# Patient Record
Sex: Female | Born: 1945 | Race: Black or African American | Hispanic: No | Marital: Married | Smoking: Never smoker
Health system: Southern US, Community
[De-identification: ages and names within clinical notes are randomized; demographics above are authoritative.]

## PROBLEM LIST (undated history)

## (undated) DIAGNOSIS — M1990 Unspecified osteoarthritis, unspecified site: Secondary | ICD-10-CM

## (undated) DIAGNOSIS — IMO0001 Reserved for inherently not codable concepts without codable children: Secondary | ICD-10-CM

## (undated) DIAGNOSIS — I10 Essential (primary) hypertension: Secondary | ICD-10-CM

## (undated) DIAGNOSIS — Z5189 Encounter for other specified aftercare: Secondary | ICD-10-CM

## (undated) DIAGNOSIS — K219 Gastro-esophageal reflux disease without esophagitis: Secondary | ICD-10-CM

## (undated) DIAGNOSIS — G709 Myoneural disorder, unspecified: Secondary | ICD-10-CM

## (undated) DIAGNOSIS — J069 Acute upper respiratory infection, unspecified: Secondary | ICD-10-CM

## (undated) DIAGNOSIS — C801 Malignant (primary) neoplasm, unspecified: Secondary | ICD-10-CM

## (undated) DIAGNOSIS — N189 Chronic kidney disease, unspecified: Secondary | ICD-10-CM

## (undated) DIAGNOSIS — G4730 Sleep apnea, unspecified: Secondary | ICD-10-CM

## (undated) HISTORY — DX: Gastro-esophageal reflux disease without esophagitis: K21.9

## (undated) HISTORY — DX: Chronic kidney disease, unspecified: N18.9

## (undated) HISTORY — DX: Malignant (primary) neoplasm, unspecified: C80.1

## (undated) HISTORY — DX: Reserved for inherently not codable concepts without codable children: IMO0001

## (undated) HISTORY — PX: UPPER GASTROINTESTINAL ENDOSCOPY: SHX188

## (undated) HISTORY — PX: INSERTION OF DIALYSIS CATHETER: SHX1324

## (undated) HISTORY — PX: VAGINAL HYSTERECTOMY: SUR661

## (undated) HISTORY — PX: CATARACT EXTRACTION W/ INTRAOCULAR LENS  IMPLANT, BILATERAL: SHX1307

## (undated) HISTORY — DX: Myoneural disorder, unspecified: G70.9

## (undated) HISTORY — PX: BREAST SURGERY: SHX581

## (undated) HISTORY — PX: DILATION AND CURETTAGE OF UTERUS: SHX78

## (undated) HISTORY — PX: BACK SURGERY: SHX140

## (undated) HISTORY — PX: COLONOSCOPY: SHX174

## (undated) HISTORY — DX: Encounter for other specified aftercare: Z51.89

## (undated) HISTORY — DX: Essential (primary) hypertension: I10

## (undated) HISTORY — PX: EYE SURGERY: SHX253

---

## 1999-12-17 ENCOUNTER — Emergency Department (HOSPITAL_COMMUNITY): Admission: EM | Admit: 1999-12-17 | Discharge: 1999-12-17 | Payer: Self-pay | Admitting: Emergency Medicine

## 1999-12-17 ENCOUNTER — Encounter: Payer: Self-pay | Admitting: Emergency Medicine

## 1999-12-21 ENCOUNTER — Inpatient Hospital Stay (HOSPITAL_COMMUNITY): Admission: AD | Admit: 1999-12-21 | Discharge: 1999-12-24 | Payer: Self-pay | Admitting: Neurology

## 1999-12-22 ENCOUNTER — Encounter: Payer: Self-pay | Admitting: Neurology

## 2000-01-20 ENCOUNTER — Ambulatory Visit (HOSPITAL_COMMUNITY): Admission: RE | Admit: 2000-01-20 | Discharge: 2000-01-20 | Payer: Self-pay | Admitting: Neurology

## 2000-01-23 ENCOUNTER — Emergency Department (HOSPITAL_COMMUNITY): Admission: EM | Admit: 2000-01-23 | Discharge: 2000-01-23 | Payer: Self-pay | Admitting: Emergency Medicine

## 2001-02-07 ENCOUNTER — Inpatient Hospital Stay (HOSPITAL_COMMUNITY): Admission: EM | Admit: 2001-02-07 | Discharge: 2001-02-09 | Payer: Self-pay | Admitting: Emergency Medicine

## 2001-02-07 ENCOUNTER — Encounter: Payer: Self-pay | Admitting: Internal Medicine

## 2001-02-07 ENCOUNTER — Encounter (INDEPENDENT_AMBULATORY_CARE_PROVIDER_SITE_OTHER): Payer: Self-pay | Admitting: *Deleted

## 2001-02-07 ENCOUNTER — Encounter: Payer: Self-pay | Admitting: Emergency Medicine

## 2001-02-08 ENCOUNTER — Encounter: Payer: Self-pay | Admitting: Gastroenterology

## 2001-02-09 ENCOUNTER — Encounter: Payer: Self-pay | Admitting: Gastroenterology

## 2001-03-22 ENCOUNTER — Encounter: Payer: Self-pay | Admitting: Gastroenterology

## 2001-03-30 ENCOUNTER — Other Ambulatory Visit: Admission: RE | Admit: 2001-03-30 | Discharge: 2001-03-30 | Payer: Self-pay | Admitting: Obstetrics & Gynecology

## 2003-06-27 ENCOUNTER — Inpatient Hospital Stay (HOSPITAL_COMMUNITY): Admission: AD | Admit: 2003-06-27 | Discharge: 2003-07-03 | Payer: Self-pay | Admitting: Internal Medicine

## 2003-08-01 ENCOUNTER — Ambulatory Visit (HOSPITAL_COMMUNITY): Admission: RE | Admit: 2003-08-01 | Discharge: 2003-08-01 | Payer: Self-pay | Admitting: Vascular Surgery

## 2003-09-26 ENCOUNTER — Ambulatory Visit (HOSPITAL_COMMUNITY): Admission: RE | Admit: 2003-09-26 | Discharge: 2003-09-26 | Payer: Self-pay | Admitting: Nephrology

## 2003-09-27 ENCOUNTER — Ambulatory Visit (HOSPITAL_COMMUNITY): Admission: AD | Admit: 2003-09-27 | Discharge: 2003-09-27 | Payer: Self-pay | Admitting: Vascular Surgery

## 2003-10-07 ENCOUNTER — Encounter: Admission: RE | Admit: 2003-10-07 | Discharge: 2003-10-07 | Payer: Self-pay | Admitting: Nephrology

## 2003-10-15 ENCOUNTER — Other Ambulatory Visit: Admission: RE | Admit: 2003-10-15 | Discharge: 2003-10-15 | Payer: Self-pay | Admitting: Obstetrics and Gynecology

## 2003-10-31 ENCOUNTER — Encounter: Admission: RE | Admit: 2003-10-31 | Discharge: 2003-10-31 | Payer: Self-pay | Admitting: Internal Medicine

## 2003-11-04 ENCOUNTER — Ambulatory Visit (HOSPITAL_COMMUNITY): Admission: RE | Admit: 2003-11-04 | Discharge: 2003-11-04 | Payer: Self-pay | Admitting: Obstetrics and Gynecology

## 2003-11-04 ENCOUNTER — Encounter (INDEPENDENT_AMBULATORY_CARE_PROVIDER_SITE_OTHER): Payer: Self-pay | Admitting: Specialist

## 2004-01-29 ENCOUNTER — Ambulatory Visit (HOSPITAL_COMMUNITY): Admission: RE | Admit: 2004-01-29 | Discharge: 2004-01-29 | Payer: Self-pay | Admitting: Nephrology

## 2004-04-29 ENCOUNTER — Ambulatory Visit (HOSPITAL_COMMUNITY): Admission: RE | Admit: 2004-04-29 | Discharge: 2004-04-29 | Payer: Self-pay | Admitting: Nephrology

## 2004-05-12 ENCOUNTER — Ambulatory Visit (HOSPITAL_COMMUNITY): Admission: RE | Admit: 2004-05-12 | Discharge: 2004-05-12 | Payer: Self-pay | Admitting: Vascular Surgery

## 2004-09-14 ENCOUNTER — Ambulatory Visit (HOSPITAL_COMMUNITY): Admission: RE | Admit: 2004-09-14 | Discharge: 2004-09-14 | Payer: Self-pay | Admitting: Vascular Surgery

## 2004-10-26 ENCOUNTER — Ambulatory Visit (HOSPITAL_COMMUNITY): Admission: RE | Admit: 2004-10-26 | Discharge: 2004-10-26 | Payer: Self-pay | Admitting: Nephrology

## 2005-08-22 DIAGNOSIS — N189 Chronic kidney disease, unspecified: Secondary | ICD-10-CM

## 2005-08-22 HISTORY — DX: Chronic kidney disease, unspecified: N18.9

## 2005-10-20 HISTORY — PX: TUMOR REMOVAL: SHX12

## 2005-10-21 HISTORY — PX: KIDNEY TRANSPLANT: SHX239

## 2005-10-28 ENCOUNTER — Emergency Department (HOSPITAL_COMMUNITY): Admission: EM | Admit: 2005-10-28 | Discharge: 2005-10-28 | Payer: Self-pay | Admitting: Emergency Medicine

## 2005-11-01 ENCOUNTER — Emergency Department (HOSPITAL_COMMUNITY): Admission: EM | Admit: 2005-11-01 | Discharge: 2005-11-01 | Payer: Self-pay | Admitting: Emergency Medicine

## 2005-12-26 ENCOUNTER — Emergency Department (HOSPITAL_COMMUNITY): Admission: EM | Admit: 2005-12-26 | Discharge: 2005-12-27 | Payer: Self-pay | Admitting: Emergency Medicine

## 2007-01-02 ENCOUNTER — Ambulatory Visit: Payer: Self-pay | Admitting: Gastroenterology

## 2007-01-02 ENCOUNTER — Encounter: Admission: RE | Admit: 2007-01-02 | Discharge: 2007-01-02 | Payer: Self-pay | Admitting: Nephrology

## 2007-01-05 ENCOUNTER — Encounter: Admission: RE | Admit: 2007-01-05 | Discharge: 2007-01-05 | Payer: Self-pay | Admitting: Nephrology

## 2007-01-30 ENCOUNTER — Encounter: Payer: Self-pay | Admitting: Gastroenterology

## 2007-01-30 ENCOUNTER — Ambulatory Visit: Payer: Self-pay | Admitting: Gastroenterology

## 2007-02-08 ENCOUNTER — Encounter: Admission: RE | Admit: 2007-02-08 | Discharge: 2007-02-08 | Payer: Self-pay | Admitting: Nephrology

## 2007-07-10 ENCOUNTER — Encounter: Admission: RE | Admit: 2007-07-10 | Discharge: 2007-07-10 | Payer: Self-pay | Admitting: General Surgery

## 2008-08-22 DIAGNOSIS — C801 Malignant (primary) neoplasm, unspecified: Secondary | ICD-10-CM

## 2008-08-22 HISTORY — DX: Malignant (primary) neoplasm, unspecified: C80.1

## 2009-01-13 ENCOUNTER — Encounter: Admission: RE | Admit: 2009-01-13 | Discharge: 2009-01-13 | Payer: Self-pay | Admitting: Nephrology

## 2009-01-15 ENCOUNTER — Encounter (INDEPENDENT_AMBULATORY_CARE_PROVIDER_SITE_OTHER): Payer: Self-pay | Admitting: Radiology

## 2009-01-15 ENCOUNTER — Encounter: Admission: RE | Admit: 2009-01-15 | Discharge: 2009-01-15 | Payer: Self-pay | Admitting: Nephrology

## 2009-02-04 ENCOUNTER — Emergency Department (HOSPITAL_COMMUNITY): Admission: EM | Admit: 2009-02-04 | Discharge: 2009-02-04 | Payer: Self-pay | Admitting: Emergency Medicine

## 2009-02-20 ENCOUNTER — Ambulatory Visit: Payer: Self-pay | Admitting: Genetic Counselor

## 2009-02-20 ENCOUNTER — Ambulatory Visit: Payer: Self-pay | Admitting: Oncology

## 2009-02-25 ENCOUNTER — Ambulatory Visit: Admission: RE | Admit: 2009-02-25 | Discharge: 2009-05-26 | Payer: Self-pay | Admitting: Radiation Oncology

## 2009-03-23 ENCOUNTER — Ambulatory Visit: Payer: Self-pay | Admitting: Oncology

## 2009-03-26 ENCOUNTER — Encounter: Admission: RE | Admit: 2009-03-26 | Discharge: 2009-03-26 | Payer: Self-pay | Admitting: General Surgery

## 2009-03-30 ENCOUNTER — Ambulatory Visit (HOSPITAL_BASED_OUTPATIENT_CLINIC_OR_DEPARTMENT_OTHER): Admission: RE | Admit: 2009-03-30 | Discharge: 2009-03-30 | Payer: Self-pay | Admitting: General Surgery

## 2009-03-30 ENCOUNTER — Encounter (INDEPENDENT_AMBULATORY_CARE_PROVIDER_SITE_OTHER): Payer: Self-pay | Admitting: General Surgery

## 2009-04-13 ENCOUNTER — Encounter (INDEPENDENT_AMBULATORY_CARE_PROVIDER_SITE_OTHER): Payer: Self-pay | Admitting: General Surgery

## 2009-04-13 ENCOUNTER — Ambulatory Visit (HOSPITAL_COMMUNITY): Admission: RE | Admit: 2009-04-13 | Discharge: 2009-04-13 | Payer: Self-pay | Admitting: General Surgery

## 2009-04-30 ENCOUNTER — Ambulatory Visit: Payer: Self-pay | Admitting: Oncology

## 2009-05-18 ENCOUNTER — Encounter: Payer: Self-pay | Admitting: Gastroenterology

## 2009-05-27 ENCOUNTER — Ambulatory Visit: Admission: RE | Admit: 2009-05-27 | Discharge: 2009-08-21 | Payer: Self-pay | Admitting: Radiation Oncology

## 2009-06-29 ENCOUNTER — Ambulatory Visit: Payer: Self-pay | Admitting: Oncology

## 2009-06-30 ENCOUNTER — Ambulatory Visit (HOSPITAL_COMMUNITY): Admission: RE | Admit: 2009-06-30 | Discharge: 2009-06-30 | Payer: Self-pay | Admitting: Anesthesiology

## 2009-07-01 LAB — CBC WITH DIFFERENTIAL/PLATELET
BASO%: 0.1 % (ref 0.0–2.0)
Basophils Absolute: 0 10*3/uL (ref 0.0–0.1)
EOS%: 7.3 % — ABNORMAL HIGH (ref 0.0–7.0)
HGB: 13.4 g/dL (ref 11.6–15.9)
MCH: 24.5 pg — ABNORMAL LOW (ref 25.1–34.0)
MCHC: 32.2 g/dL (ref 31.5–36.0)
MCV: 76.1 fL — ABNORMAL LOW (ref 79.5–101.0)
MONO%: 14.9 % — ABNORMAL HIGH (ref 0.0–14.0)
RDW: 16.1 % — ABNORMAL HIGH (ref 11.2–14.5)

## 2009-07-01 LAB — COMPREHENSIVE METABOLIC PANEL
ALT: 73 U/L — ABNORMAL HIGH (ref 0–35)
BUN: 13 mg/dL (ref 6–23)
CO2: 25 mEq/L (ref 19–32)
Calcium: 10 mg/dL (ref 8.4–10.5)
Chloride: 97 mEq/L (ref 96–112)
Creatinine, Ser: 0.8 mg/dL (ref 0.40–1.20)
Glucose, Bld: 291 mg/dL — ABNORMAL HIGH (ref 70–99)

## 2009-07-22 ENCOUNTER — Ambulatory Visit (HOSPITAL_COMMUNITY): Admission: RE | Admit: 2009-07-22 | Discharge: 2009-07-22 | Payer: Self-pay | Admitting: Anesthesiology

## 2009-07-23 ENCOUNTER — Emergency Department (HOSPITAL_COMMUNITY): Admission: EM | Admit: 2009-07-23 | Discharge: 2009-07-23 | Payer: Self-pay | Admitting: Emergency Medicine

## 2009-10-20 ENCOUNTER — Ambulatory Visit: Payer: Self-pay | Admitting: Oncology

## 2009-11-18 LAB — CBC WITH DIFFERENTIAL/PLATELET
Basophils Absolute: 0 10*3/uL (ref 0.0–0.1)
EOS%: 1.4 % (ref 0.0–7.0)
Eosinophils Absolute: 0.1 10*3/uL (ref 0.0–0.5)
HCT: 39.7 % (ref 34.8–46.6)
HGB: 13 g/dL (ref 11.6–15.9)
MCH: 23.9 pg — ABNORMAL LOW (ref 25.1–34.0)
MCV: 73.1 fL — ABNORMAL LOW (ref 79.5–101.0)
NEUT%: 58.3 % (ref 38.4–76.8)
lymph#: 1.7 10*3/uL (ref 0.9–3.3)

## 2009-11-18 LAB — COMPREHENSIVE METABOLIC PANEL
AST: 14 U/L (ref 0–37)
BUN: 12 mg/dL (ref 6–23)
Calcium: 9.8 mg/dL (ref 8.4–10.5)
Chloride: 100 mEq/L (ref 96–112)
Creatinine, Ser: 0.83 mg/dL (ref 0.40–1.20)
Glucose, Bld: 233 mg/dL — ABNORMAL HIGH (ref 70–99)

## 2010-01-26 ENCOUNTER — Encounter: Admission: RE | Admit: 2010-01-26 | Discharge: 2010-01-26 | Payer: Self-pay | Admitting: Oncology

## 2010-02-08 ENCOUNTER — Telehealth (INDEPENDENT_AMBULATORY_CARE_PROVIDER_SITE_OTHER): Payer: Self-pay | Admitting: *Deleted

## 2010-04-12 ENCOUNTER — Ambulatory Visit: Payer: Self-pay | Admitting: Oncology

## 2010-08-27 IMAGING — CT CT PELVIS W/O CM
1 series · 16 of 32 positions shown, 20 images · non-contrast
Comparison: None available

CLINICAL DATA: Left hip pain.  History breast carcinoma.

CT PELVIS WITHOUT CONTRAST
TECHNIQUE: Multidetector CT imaging of the pelvis was performed
following the standard protocol without intravenous contrast.

[Series 5: pelvis_hip 3.0 b40f st 3d · axial · 0.90mm/px · z∈[-562,-310]mm · 16 of 94 slices shown, 20 images]
[im 7/94  soft-tissue]
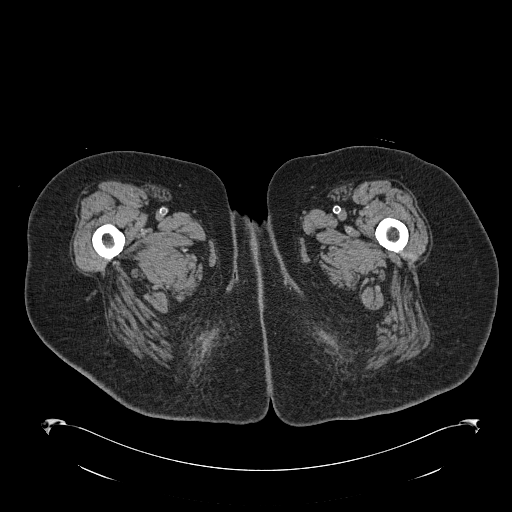
[im 7/94  bone]
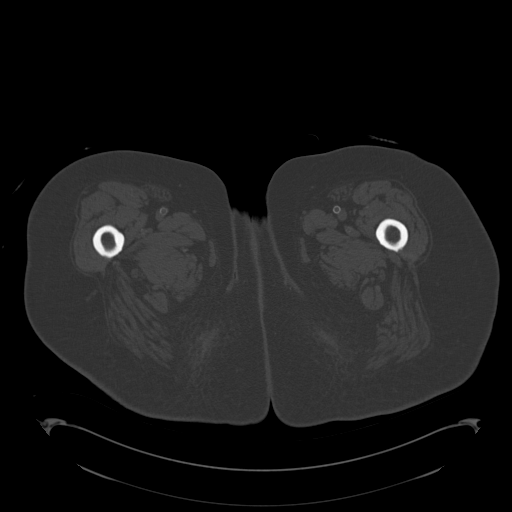
[im 13/94  soft-tissue]
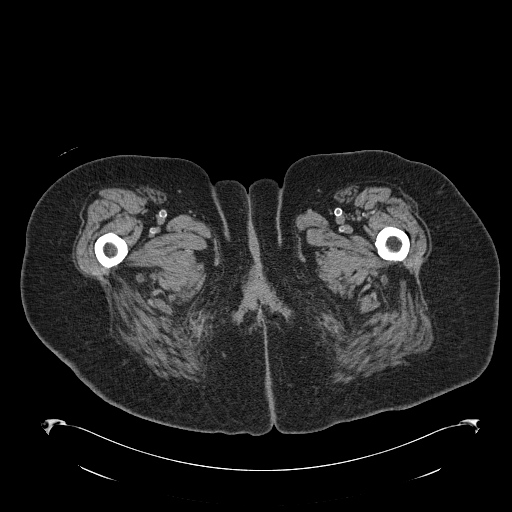
[im 19/94  soft-tissue]
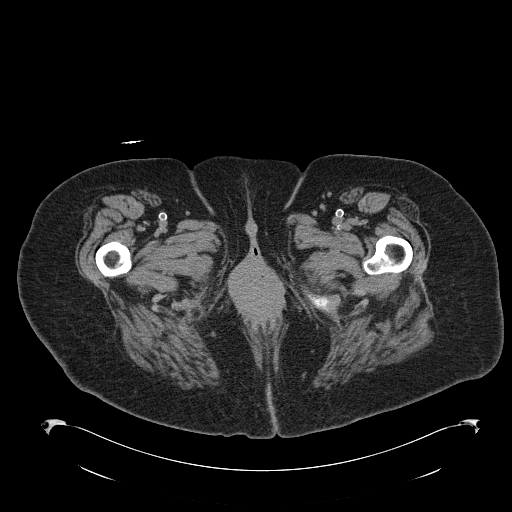
[im 25/94  soft-tissue]
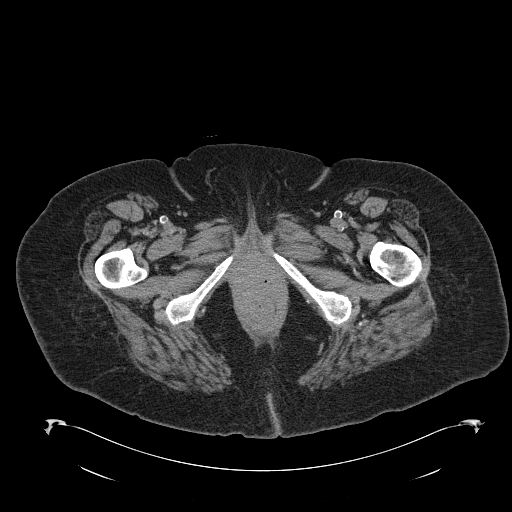
[im 31/94  soft-tissue]
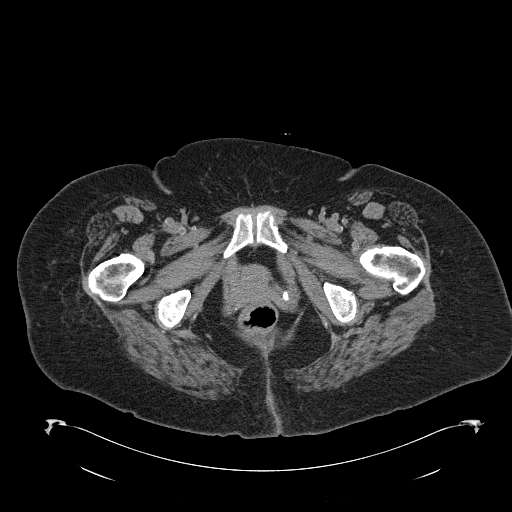
[im 37/94  soft-tissue]
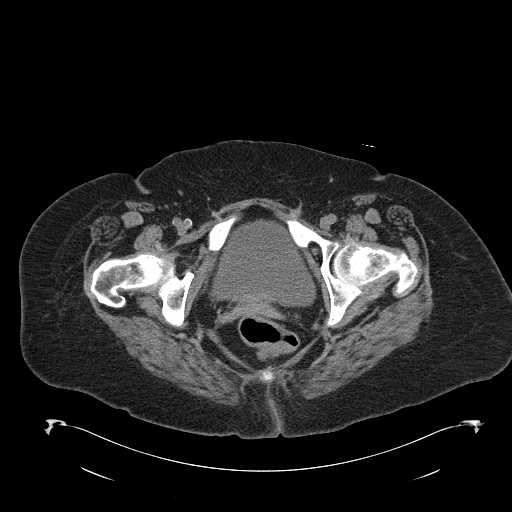
[im 43/94  soft-tissue]
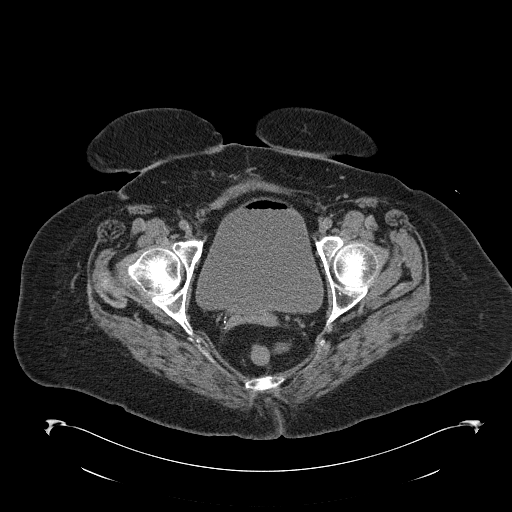
[im 52/94  soft-tissue]
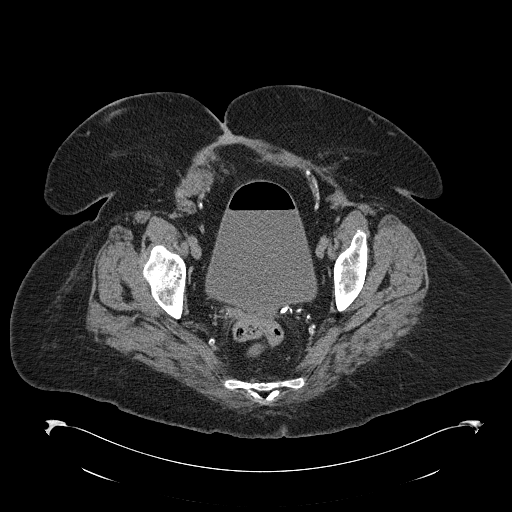
[im 58/94  soft-tissue]
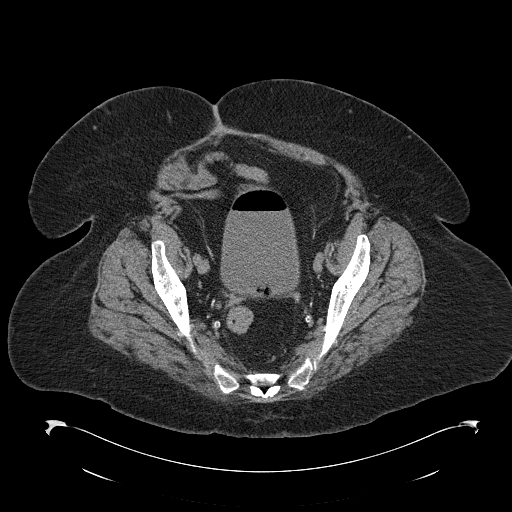
[im 58/94  bone]
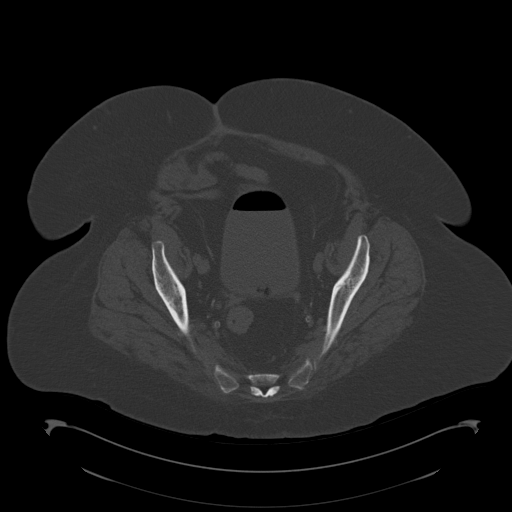
[im 64/94  soft-tissue]
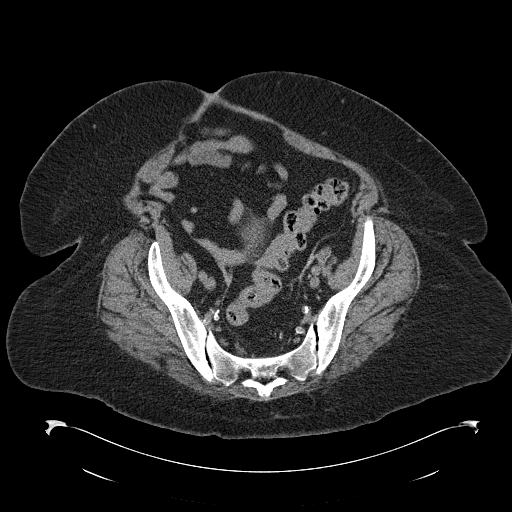
[im 70/94  soft-tissue]
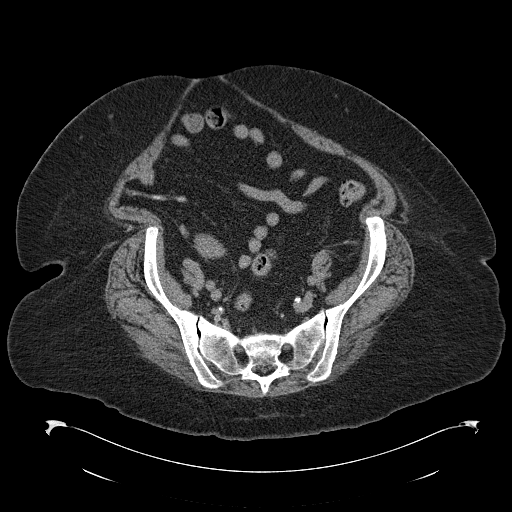
[im 76/94  soft-tissue]
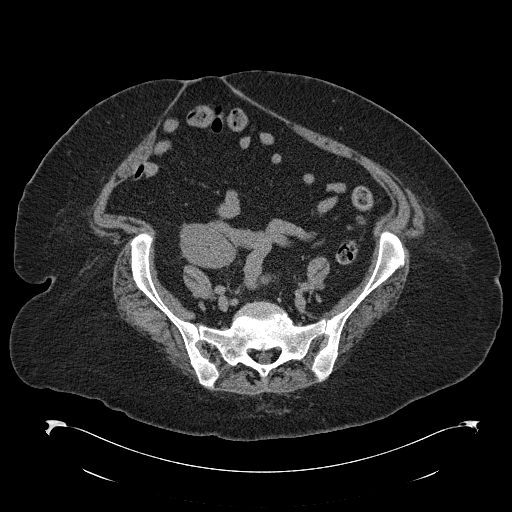
[im 82/94  soft-tissue]
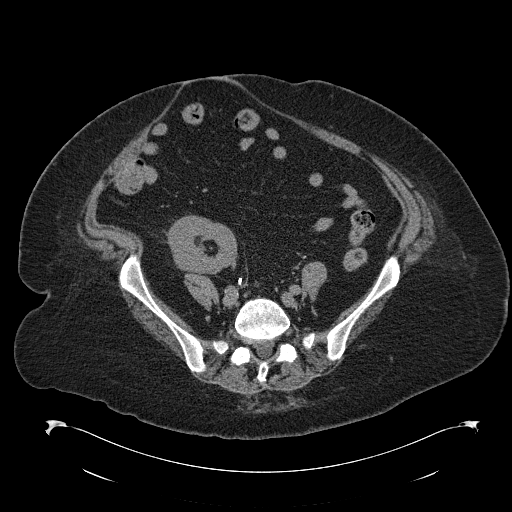
[im 82/94  lung]
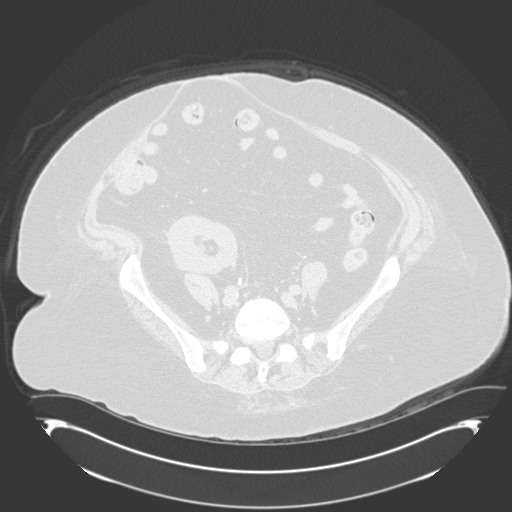
[im 85/94  lung]
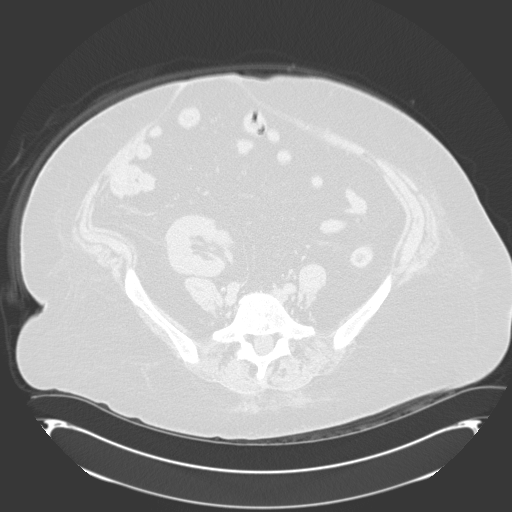
[im 88/94  soft-tissue]
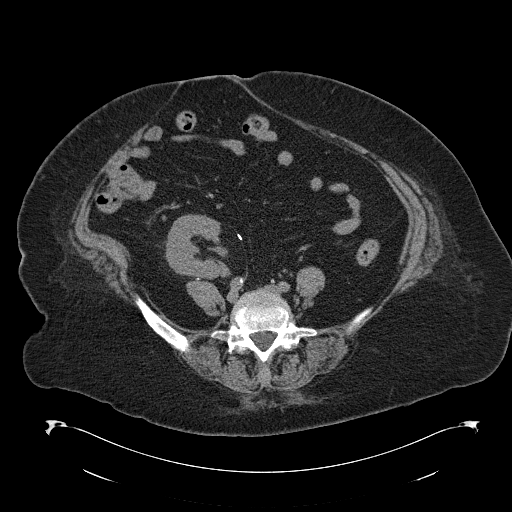
[im 88/94  lung]
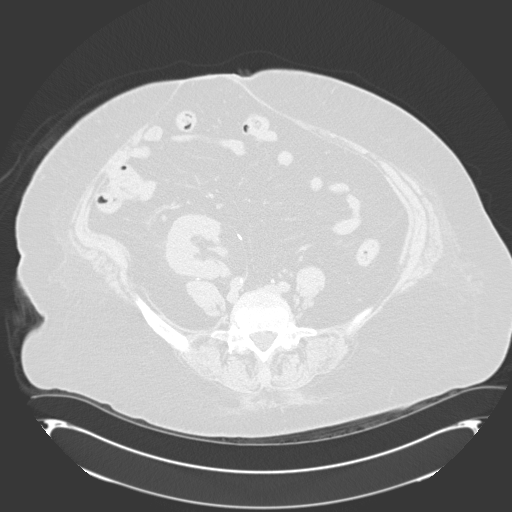
[im 91/94  lung]
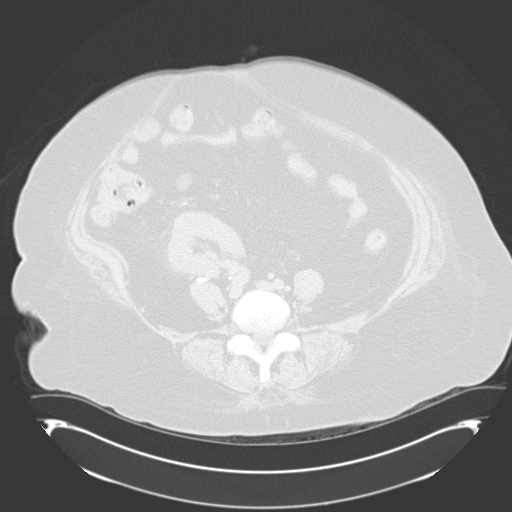

[16 of 32 positions shown; findings below may reference images not displayed]

FINDINGS: Negative for fracture or dislocation.  There is vacuum
phenomenon noted in both sacroiliac joints.  No lytic or sclerotic
lesion.  Sacrum intact.

There is gas in the distended urinary bladder.  No free fluid.
Normal appendix is noted.  Visualized portions of small bowel and
colon are decompressed.  Bilateral pelvic phleboliths.  Previous
hysterectomy.  There is a right pelvic kidney without
hydronephrosis.
IMPRESSION: 1.  Negative for fracture, dislocation, lytic or sclerotic bone
lesion.
2.  Distended urinary bladder containing gas.  Correlate with any
recent instrumentation to exclude fistula.

## 2010-09-13 ENCOUNTER — Encounter: Payer: Self-pay | Admitting: General Surgery

## 2010-09-23 NOTE — Progress Notes (Signed)
  Phone Note Other Incoming   Request: Send information Summary of Call: Request for records received from [facility] [place], [place].L.P Request forwarded to [facility].

## 2010-11-27 LAB — COMPREHENSIVE METABOLIC PANEL
ALT: 69 U/L — ABNORMAL HIGH (ref 0–35)
CO2: 31 mEq/L (ref 19–32)
Calcium: 10.1 mg/dL (ref 8.4–10.5)
Creatinine, Ser: 0.86 mg/dL (ref 0.4–1.2)
GFR calc Af Amer: >60 mL/min (ref >60)
GFR calc non Af Amer: >60 mL/min (ref >60)
Glucose, Bld: 62 mg/dL — ABNORMAL LOW (ref 70–99)
Sodium: 137 mEq/L (ref 135–145)
Total Protein: 7.2 g/dL (ref 6.0–8.3)

## 2010-11-27 LAB — CBC
HCT: 41.1 % (ref 36.0–46.0)
Hemoglobin: 13.2 g/dL (ref 12.0–15.0)
Hemoglobin: 13.6 g/dL (ref 12.0–15.0)
MCHC: 32.1 g/dL (ref 30.0–36.0)
MCV: 76.6 fL — ABNORMAL LOW (ref 78.0–100.0)
MCV: 76.9 fL — ABNORMAL LOW (ref 78.0–100.0)
RBC: 5.36 MIL/uL — ABNORMAL HIGH (ref 3.87–5.11)
RBC: 5.53 MIL/uL — ABNORMAL HIGH (ref 3.87–5.11)
RDW: 16.2 % — ABNORMAL HIGH (ref 11.5–15.5)
WBC: 11.6 10*3/uL — ABNORMAL HIGH (ref 4.0–10.5)

## 2010-11-27 LAB — GLUCOSE, CAPILLARY
Glucose-Capillary: 142 mg/dL — ABNORMAL HIGH (ref 70–99)
Glucose-Capillary: 178 mg/dL — ABNORMAL HIGH (ref 70–99)
Glucose-Capillary: 208 mg/dL — ABNORMAL HIGH (ref 70–99)

## 2010-11-27 LAB — BASIC METABOLIC PANEL
BUN: 14 mg/dL (ref 6–23)
Chloride: 99 mEq/L (ref 96–112)
GFR calc Af Amer: >60 mL/min (ref >60)
GFR calc non Af Amer: >60 mL/min (ref >60)
Potassium: 4.6 mEq/L (ref 3.5–5.1)
Sodium: 136 mEq/L (ref 135–145)

## 2010-11-27 LAB — LACTATE DEHYDROGENASE: LDH: 236 U/L (ref 94–250)

## 2010-11-27 LAB — DIFFERENTIAL
Lymphocytes Relative: 39 % (ref 12–46)
Lymphs Abs: 3.4 10*3/uL (ref 0.7–4.0)
Monocytes Relative: 12 % (ref 3–12)
Neutrophils Relative %: 47 % (ref 43–77)

## 2010-11-27 LAB — URINALYSIS, ROUTINE W REFLEX MICROSCOPIC
Nitrite: NEGATIVE
Specific Gravity, Urine: 1.024 (ref 1.005–1.030)
Urobilinogen, UA: 0.2 mg/dL (ref 0.0–1.0)
pH: 7.5 (ref 5.0–8.0)

## 2010-11-27 LAB — URINE MICROSCOPIC-ADD ON
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
## 2011-01-04 NOTE — Op Note (Signed)
[name], [name]             ACCOUNT NO.:  [id number]   MEDICAL RECORD NO.:  [id number]          PATIENT TYPE:  AMB   LOCATION:  DSC                          FACILITY:  [facility]   PHYSICIAN:  Angelia Mould. Derrell Lolling, M.D.DATE OF BIRTH:  October 28, 1945   DATE OF PROCEDURE:  03/30/2009  DATE OF DISCHARGE:                               OPERATIVE REPORT   PREOPERATIVE DIAGNOSES:  1. Ductal carcinoma in situ, left breast, 3 o'clock position lateral.  2. Atypical ductal hyperplasia, left breast, 3 o'clock position,      medial.   OPERATION PERFORMED:  Left partial mastectomy with needle localization  x2, specimen mammogram.   SURGEON:  Angelia Mould. Derrell Lolling, MD   OPERATIVE INDICATIONS:  This is a 65 year old African American female  with a history of end-stage renal disease, hypertension, but currently  immunosuppressed because of a functioning renal transplant, and  diabetes.  Recent imaging studies showed a 1.2-cm area of density in the  left breast laterally at the 3 o'clock position which was biopsied and  showed ductal carcinoma in situ, hormone receptor positive.  She had  family history of breast cancer in one sister.  Genetic testing was  negative for BRCA1 and BRCA2 genes.  Further imaging suggested another  area of density in the left breast at the 3 o'clock position very  medially near the areolar margin.  This was seen on breast specific  gamma imaging.  This area was biopsied and showed atypical ductal  hyperplasia.  Options were discussed with the patient.  She would like  to have breast conservation if possible.  I told her we could attempt  this to see how good the margins were and so we are attempting a partial  mastectomy today.  She is already being seen by Radiation Oncology who  felt that she would be a good candidate for radiation therapy if we  could get negative margins.  She underwent wire localization of both  areas.  The laterally placed wire went through the area of  DCIS.  The  medially placed wire was very superficially placed and just under the  skin with the area of atypical ductal hyperplasia deep to this.  Dr.  Samuella Cota in radiology stated that one of the wire markers had migrated and  would probably not be in the specimen but he was certain of the wire  placement.  The patient was brought to the operating room following wire  placement.   OPERATIVE TECHNIQUE:  Following the induction of a general LMA  anesthetic, the patient's left breast was prepped and draped in a  sterile fashion.  The patient was identified as the correct patient,  correct procedure and correct site.  I reviewed the mammograms placed  them up on the view box and planned the incision.   One of the wires entered the left breast at about 3 o'clock position  close to the areolar margin, the more laterally placed wire entered the  left breast at about the 4 o'clock position and more laterally placed.  I made a radially oriented elliptical incision to encompass both wires.  Dissection  was carried down through the subcutaneous tissue.  In the  subareolar area and superiorly I went right under the skin because the  wire was so superficially placed but I got the entire wire removed.  Medially, laterally and inferiorly I was able to go deeper into the  breast and get all of the tissue out.  The specimen was marked with a  six color margin marker kit.  Dr. Samuella Cota at the [facility] did a specimen mammogram and both wires were seen and that the  more superiorly placed wire was very superficial and given the fact that  it was very superficially placed on excision that is all that we needed  to do.  We will see what the pathology shows.  I felt that she would  most likely need a mastectomy if I got a positive margin in either of  these areas.  The wound was irrigated with saline.  Hemostasis was  excellent and achieved with electrocautery.  I closed the breast tissue  in  two separate layers with interrupted sutures of 3-0 Vicryl, and the  skin was closed with a running subcuticular suture of 4-0 Monocryl and  Steri-Strips.  Clean bandages were placed and the patient taken to  recovery room in stable condition.   ESTIMATED BLOOD LOSS:  About 20 mL.   COMPLICATIONS:  None.   Sponge, needle and instrument counts were correct.      Angelia Mould. Derrell Lolling, M.D.  Electronically Signed     HMI/MEDQ  D:  03/30/2009  T:  03/31/2009  Job:  [id number]   cc:   Garnetta Buddy, M.D.  [facility]

## 2011-01-04 NOTE — Op Note (Signed)
[name], [name]             ACCOUNT NO.:  [id number]   MEDICAL RECORD NO.:  [id number]          PATIENT TYPE:  AMB   LOCATION:  SDS                          FACILITY:  [facility]   PHYSICIAN:  Angelia Mould. Derrell Lolling, M.D.DATE OF BIRTH:  1945/10/28   DATE OF PROCEDURE:  04/13/2009  DATE OF DISCHARGE:                               OPERATIVE REPORT   PREOPERATIVE DIAGNOSIS:  Ductal carcinoma in situ left breast, with  positive resection margins following lumpectomy.   POSTOPERATIVE DIAGNOSIS:  Ductal carcinoma in situ left breast, with  positive resection margins following lumpectomy.   OPERATION PERFORMED:  Reexcision anterior, superior, inferior, lateral,  and deep margins of left partial mastectomy wound.   SURGEON:  Angelia Mould. Derrell Lolling, MD   OPERATIVE INDICATIONS:  This is a 65 year old African American female  who was found to have ductal carcinoma in the left breast laterally.  This was somewhat complex excision requiring 2 wires to localize this.  The bracketed-needle localization partial mastectomy was performed on  March 30, 2009.  The final pathology report showed ductal carcinoma in  situ involving the lateral margin of resection and it was also very  close to the superior and very close to the inferior margins.  I  discussed these findings with the patient.  She is still motivated  towards breast conservation.  I felt that it was worth one more attempt  at excision, and she is brought back to the operating room for  reexcision of her lateral, superior, inferior margins.   OPERATIVE TECHNIQUE:  The patient was brought to the operating room.  General anesthesia was induced with an LMA device.  The left breast was  prepped and draped in a sterile fashion.  Intravenous antibiotics were  given.  The patient was identified as correct the patient, correct  procedure.   I observed the radial incision in the left breast inferolaterally.  Using a marking pen, I marked an elliptical  incision to excise most of  the lateral part of this incision and to go further laterally on the  skin.  This elliptical incision was made.  Using electrocautery, I then  dissected deep into the breast getting inferior, lateral, superior, and  deep margins widely around the resection cavity.  I marked the superior,  lateral, inferior, and posterior margins of the new specimen with a  margin marker kit.  The wound was irrigated with saline.  Hemostasis was  excellent and achieved with electrocautery.  I closed the breast tissue  in several layers with interrupted sutures of 3-0 Vicryl, and I closed  the skin with interrupted sutures of 3-0 nylon.  Clean bandages were  placed, and the patient taken to the recovery room in stable condition.  Estimated blood loss was about 25 mL.  Complications none.  Sponge,  needle, and instrument counts were correct.      Angelia Mould. Derrell Lolling, M.D.  Electronically Signed     HMI/MEDQ  D:  04/13/2009  T:  04/14/2009  Job:  [id number]   cc:   Durene Romans. Lestine Box, MD  Garnetta Buddy, M.D.

## 2011-01-07 NOTE — Consult Note (Signed)
[name], [name] NO.:  [id number]   MEDICAL RECORD NO.:  [id number]                   PATIENT TYPE:  INP   LOCATION:  5502                                 FACILITY:  [facility]   PHYSICIAN:  Billey Gosling, M.D.                 DATE OF BIRTH:  13-Jan-1946   DATE OF CONSULTATION:  06/28/2003  DATE OF DISCHARGE:                                   CONSULTATION   REFERRING PHYSICIAN:  Alvester Morin, M.D.   HISTORY OF PRESENT ILLNESS:  This 65 year old African-American female with  history of hypertension and diabetes mellitus and anemia, presented with  worsening shortness of breath and decreased urine output.  She was seen by  Macarthur Critchley. Torelli, M.D. three weeks ago and found to have elevated blood  pressure.  She was placed on an ARB/diuretic combination two weeks ago and  that has been increased over that period of time.  On recent follow-up, her  blood pressure was still elevated and labs were obtained which revealed an  elevated creatinine. Therefore, the patient was sent to [facility]  for admission.  The patient now complains of weakness, fatigue, weight gain,  and cold intolerance which has been worsening recently.  She also states she  has shortness of breath with exertion, but no chest pain.  She was seen by  [place] C. Lowell Guitar, M.D. of [facility] years ago to check  her kidneys since she has diabetes and did not follow-up secondary to  having no insurance. The patient states a recent echocardiogram was within  normal limits and she was scheduled for a stress test, but it has not been  done secondary to elevated blood pressure.  She also had recent sleep study  and the results are pending.  She does have occasional nausea, but no emesis  and is tolerating p.o.   REVIEW OF SYSTEMS:  Per HPI as well as positive for light headache and lower  extremity edema for the past few months.  Negative for fevers, dysuria,  abdominal  pain, constipation, history of depression.   PAST MEDICAL HISTORY:  1. Hypertension, poorly controlled.  2. Diabetes mellitus type 2.  3. Microcytic anemia.  4. Complex ovarian cyst.  5. Gastroesophageal reflux disease.  6. Status post cataract surgery bilaterally.  7. Status post partial hysterectomy.   CURRENT MEDICATIONS:  1. Norvasc 5 mg daily.  2. Cipro 500 mg q.24h.  3. Nexium 40 mg daily.  4. Amaryl 4 mg daily.  5. Toprol XL 100 mg daily.  6. Tylenol p.r.n.   ALLERGIES:  No known drug allergies.   SOCIAL HISTORY:  The patient lives in [place] with husband. She has four  children.  Denies tobacco or alcohol use and is on disability secondary to  her eyesight.   FAMILY HISTORY:  Mother deceased secondary to pulmonary embolism.  Father  deceased secondary to Alzheimer's and  had diabetes.  Mother also had thyroid  disease and the patient has one aunt with kidney disease who is on  hemodialysis.   PHYSICAL EXAMINATION:  VITAL SIGNS:  Temperature 97.3, pulse 62,  respirations 18, blood pressure 176/81.  Ins and outs; 1005/650. CBG 143,  123, 133.  GENERAL:  Flat affect, poor eye contact, but in no acute distress.  She is  alert and oriented x3.  HEENT:  Pupils equal, round, and reactive to light and accommodation.  Extraocular movements intact, nonicteric.  Moist mucous membranes.  Oropharynx clear.  NECK:  Supple.  No lymphadenopathy.  No thyromegaly.  HEART:  Regular rate and rhythm without murmurs, rubs, or gallops.  LUNGS:  Clear to auscultation bilaterally with good effort.  ABDOMEN:  Soft, obese, nontender, and nondistended.  Normal active bowel  sounds.  EXTREMITIES:  Trace edema left lower extremity.  1 to 2+ edema in right  lower extremity.  2+ pedal pulses bilaterally.  No calf tenderness.  SKIN:  No rashes or lesions.  NEUROLOGY:  Cranial nerves II-XII grossly intact.  5/5 strength bilaterally.  Random alternating movements intact.   LABORATORY DATA:   Sodium 134, potassium 4.4, chloride 110, bicarb 16, BUN  71, creatinine 7.6, glucose 101, calcium 7.9.  White blood cell count 4.6,  hemoglobin 7.5, hematocrit 23.2, platelets 312, MCV 70.6, ferritin 237, iron  44, 15% saturation. TSH 1.09.  Urinalysis; greater than 300 protein, small  leukocyte esterase, negative nitrite with 11 to 20 white blood cells.  LDL  112, HDL 35, triglycerides 83.  Magnesium 2.5, phosphorus 6.6, albumin 2.9.  Hemoglobin A1C 6.6.  Urine sodium 90, urine creatinine 36.8, and creatinine  from June of 2002 was 1.1.   ASSESSMENT:  A 65 year old African-American female with uncontrolled  hypertension, diabetes, and anemia now with elevated BUN and creatinine.   Problem 1.  Acute renal failure - maybe on top of chronic renal failure  secondary to diabetes and/or hypertension.  Also consider ATN versus AIN as  the __________ is greater than 3%.  Although, the patient has recently been  on Lasix and may have altered this lab test.  Agree with current workup with  renal ultrasound and labs.  Hold ARB as that was started two weeks ago and  may be contributing to the acute renal failure.  Start renal diet, and check  SPEP, UPEP, intact PPH, C3, C4, and urine for eosinophils.  Doubt this is  prerenal as the patient appears euvolemic and recent echo was within normal  limits for the patient.  May need fluid restriction and hemodialysis as her  sodium is decreased which may be secondary to hypervolemia.  The patient  needs aggressive treatment for blood pressure control to prevent further  insult to the kidneys.   Problem 2.  Microcytic anemia.  Iron studies do not support iron deficiency  and may be secondary to #1 and/or thalassemia.  The patient had hemoglobin  electrophoresis in the past, ? also check guaiac or GI source or maybe  secondary to menstruation.  Will start Aranesp and given IV iron.  Problem 3.  Diabetes mellitus type 2, needs tight control secondary to   problem #1.  Continue Amaryl and avoid Metformin. Hemoglobin A1C now at goal  of less than 7.   Problem 4.  Lower extremity edema, right greater than left.  Echo within  normal limits for patient and will get stress test as outpatient.  Consider  Dopplers to rule out DVT versus  vascular insufficiency since edema is  asymmetric.   Problem 5.  Hypertension - poorly controlled.  Avoid ACE inhibitor/ARB  secondary to #1 and now on Norvasc and Toprol.  Consider increasing Norvasc  as heart rate is on the low side and would avoid beta blocker increase.   Problem 6.  Gastroesophageal reflux disease. Controlled on Nexium.  This  case was discussed with Maree Krabbe, M.D. and see his addendum for  further details.                                               Billey Gosling, M.D.    AS/MEDQ  D:  06/28/2003  T:  06/28/2003  Job:  [id number]

## 2011-01-07 NOTE — Discharge Summary (Signed)
NAMETEONDRA, NEWBURG                       ACCOUNT NO.:  [id number]   MEDICAL RECORD NO.:  [id number]                   PATIENT TYPE:  INP   LOCATION:  5524                                 FACILITY:  [facility]   PHYSICIAN:  Alvester Morin, M.D.               DATE OF BIRTH:  1946-03-27   DATE OF ADMISSION:  06/27/2003  DATE OF DISCHARGE:  07/03/2003                                 DISCHARGE SUMMARY   DISCHARGE DIAGNOSES:  1. Acute renal failure.  2. Diabetes mellitus.  3. Hypertension.  4. Anemia.  5. Reflux disease.  6. Diabetic retinopathy.  7. History of adnexal ovarian cyst.  8. History of a partial hysterectomy.  9. History of bilateral cataract surgeries.   DISCHARGE MEDICATIONS AND INSTRUCTIONS:  1. Amaryl 5 mg one p.o. daily.  2. Norvasc 10 mg one p.o. daily.  3. Lasix 40 mg one p.o. daily.  4. Toprol-XL 100 mg p.o. daily.  5. Clonidine 0.1 mg p.o. b.i.d.  6. Nexium 40 mg daily.  7. Initial hemodialysis orders: Tuesday, Thursday, Saturday, 3.5 DF 300 x1,     then 400 DF 800, EDW 85 kg, rounded MDW evaluated after variable sodium,     light heparin x2 weeks, then standard heparin 3.7 potassium bath, 10,000     Epogen with hemodialysis three times a week, __________  13 mg weekly, 1     mcg Calcijex with hemodialysis three times a week,  8. Diet:  Renal diet, less than 80 g of protein, less than 2 g potassium,     less than 2 g sodium per day.  9. The patient was instructed not to take blood pressure medicines on     dialysis days unless told otherwise by her M.D.  10.      Follow-up appointment:  [facility] tomorrow, on     July 04, 2003, at 11:00 a.m. to sign papers and report to [facility], then return on Saturday, July 05, 2003, for dialysis, at 10:30     a.m.  The phone number is [phone].   HPI:  This is a 65 year old female with a past medical history significant  for diabetes and uncontrolled hypertension, who came to the ED with  worsening shortness of breath and oliguria.   HOSPITAL COURSE:  1. Acute renal failure on admission.  Ms. Null was found to have a BUN of     74 and creatinine of 7.7.  Renal consult was obtained.  Following the     Nephrology consult, a Diatech catheter was placed.  It is likely that the     patient's acute renal failure may have been secondary to the previous     addition of an angiotensin-receptor blocker on top of chronic renal     insufficiency due to her diabetes and hypertension.  The patient was     dialyzed on July 02, 2003.  CVTS was consulted for permanent access.     The patient's renal failure will likely require hemodialysis for an     indeterminate amount of time.  The patient was set up for hemodialysis on     an outpatient basis.  2. Hypertension.  The patient is on multiple medical therapies, previously     has been on antihypertensives and has discontinued them.  She did not     like the way that they made her feel.  She states that she has had     difficulty tolerating clonidine in the past, but was willing to try it     again.  She was discharged to home on the aforementioned     antihypertensives, with blood pressures in the range of 160-184/70-100 on     the day of her discharge.  3. Anemia.  The patient was anemic on admission, and was started on iron     therapy.  The patient will receive Epogen during her dialysis treatments.     Her anemia may be secondary to her renal failure, and should be followed     up by her primary care physician.  4. Diabetes mellitus.  On the day of discharge, CBGs were in the range of     150-185.  The patient was discharged on Amaryl.  Further medication     titration should be employed by her primary care physician, in order to     help the patient maintain glycemic control.  5. Complex adnexal mass.  The patient has a history of complex adnexal mass,     which should be followed up on an outpatient basis.   DISPOSITION:   The patient is discharged to home in stable condition, with  the aforementioned follow-up instructions.  The patient has also been asked  to follow up at the [facility] outpatient clinic in [place] if it is  convenient for her.  She can follow up with Dr. Donald Pore, and she has  been given the number [phone] to schedule an appointment at her  convenience.   STUDIES:  1. 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  2. June 28, 2003.  Renal aortic ultrasound.  Normal renal ultrasound.  3. June 29, 2003.  Right IJ Diatech catheter inserted, tip in the     SVC/right atrial junction, no pneumothorax, no significant changes     compared to June 27, 2003.      Donald Pore, MD                          Alvester Morin, M.D.    HP/MEDQ  D:  09/26/2003  T:  09/27/2003  Job:  [id number]

## 2011-01-07 NOTE — H&P (Signed)
[name], [name]                       ACCOUNT NO.:  [id number]   MEDICAL RECORD NO.:  [id number]                   PATIENT TYPE:  AMB   LOCATION:  SDC                                  FACILITY:  [facility]   PHYSICIAN:  [name], M.D.              DATE OF BIRTH:  1946/01/22   DATE OF ADMISSION:  DATE OF DISCHARGE:                                HISTORY & PHYSICAL   CHIEF COMPLAINT:  Left labial swelling and probable Bartholin's cyst.   HISTORY OF PRESENT ILLNESS:  The patient is a 65 year old gravida 4, para 3,  who presented for an annual examination secondary to being on a transplant  list.  The asymptomatic Bartholin's cyst was found during the examination.  The patient is currently without complaints.   PAST MEDICAL HISTORY:  1. Significant for renal failure. The patient is on dialysis for four     months.  2. Hypertension  3. Diabetes  4. Bilateral retinopathy.   PAST OBSTETRICAL HISTORY:  Significant for vaginal delivery x3 without  complications.  She had one miscarriage in the first trimester and did have  a D&E for that.   PAST SURGICAL HISTORY:  Significant for bilateral cataract surgery and  bilateral eye surgery with lasers x2. She had a vaginal hysterectomy  secondary to fibroids and a D&E.  Also a tubal ligation.   ALLERGIES:  NO KNOWN DRUG ALLERGIES   SOCIAL HISTORY:  Negative for alcohol, drug, or tobacco use.  She is  unemployed.   PAST GYN HISTORY:  Menarche started at age 65.  The patient is currently not  without any menses secondary to hysterectomy.  The patient denies having any  sexually transmitted disease or abnormal Pap smears.   FAMILY HISTORY:  Unremarkable for GYN cancer.   REVIEW OF SYSTEMS:  ENDOCRINE:  Significant for diabetes. Also significant  for bilateral retinopathy.  CARDIOVASCULAR:  Significant for hypertension  and renal disease.  MUSCULOSKELETAL:  Unremarkable.  PSYCHIATRIC:  Unremarkable.   MEDICATIONS:  1. Quinine  sulfate.  2. Glipizide.  3. Clonidine.  4. Nexium.  5. Lisinopril.  6. Amaryl.  7. Nephro-Vite.  8. Norvasc.  9. Toprol.  10.      Lecithin.   PHYSICAL EXAMINATION:  VITAL SIGNS:  Blood pressure 150/98, she weighs 175  pounds.  HEENT:  Pupils are equal, hearing is normal, throat is clear.  Thyroid is  not enlarged.  She has a Port-A-Cath in her right subclavian vessel.  HEART:  Regular rate and rhythm.  LUNGS:  Clear to auscultation bilaterally.  BREASTS:  No masses, discharge, skin changes, or nipple retraction.  BACK:  No CVA tenderness.  ABDOMEN:  Nontender without any masses or organomegaly.  EXTREMITIES:  No cyanosis, clubbing, or edema.  NEUROLOGY:  Within normal limits.  PELVIC:  She has a left labial mobile cyst and some swelling.  Cervix is  absent.  Uterus is also surgically absent. Adnexa  has no masses.  Rectovaginal examination is without any masses.   ASSESSMENT:  Left-sided swelling and a left labial cyst, possible  Bartholin's cyst.  The patient reports that this cyst is asymptomatic,  however, she reports she never had a history of Bartholin's cyst.   PLAN:  Marsupialize and biopsy the cyst wall to rule out any Bartholin's  cancer.  The patient has renal failure and on dialysis, currently on the  transplant list. A Pap smear was done and noted to be normal.  Release for  permission for mammogram was done.                                               [name], M.D.    NAD/MEDQ  D:  11/03/2003  T:  11/03/2003  Job:  [id number]

## 2011-04-06 ENCOUNTER — Ambulatory Visit (AMBULATORY_SURGERY_CENTER): Payer: Medicare Other | Admitting: *Deleted

## 2011-04-06 VITALS — Ht 67.0 in | Wt 244.0 lb

## 2011-04-06 DIAGNOSIS — Z1211 Encounter for screening for malignant neoplasm of colon: Secondary | ICD-10-CM

## 2011-04-06 MED ORDER — PEG-KCL-NACL-NASULF-NA ASC-C 100 G PO SOLR: ORAL | Status: DC

## 2011-04-21 ENCOUNTER — Encounter: Payer: Self-pay | Admitting: Gastroenterology

## 2011-04-21 ENCOUNTER — Ambulatory Visit (AMBULATORY_SURGERY_CENTER): Payer: Medicare Other | Admitting: Gastroenterology

## 2011-04-21 VITALS — BP 137/75 | HR 82 | Temp 96.1°F | Resp 16 | Ht 67.0 in | Wt 244.0 lb

## 2011-04-21 DIAGNOSIS — Z1211 Encounter for screening for malignant neoplasm of colon: Secondary | ICD-10-CM

## 2011-04-21 MED ORDER — SODIUM CHLORIDE 0.9 % IV SOLN: 500.0000 mL | INTRAVENOUS | Status: DC

## 2011-04-21 NOTE — Patient Instructions (Signed)
FOLLOW DISCHARGE INSTRUCTIONS (BLUE & GREEN SHEETS).    

## 2011-04-21 NOTE — Progress Notes (Signed)
1118 PATIENT LEFT LATERAL POSITION. CUFF ONRT LEG.READING OF 51/24, CHANGES CUFF TO LEFT LEG BP 12/67. 1128 BP 75/49 READJUSTED CUFF, PATIENT WITH 3 PLUS RADIAL PULSE, NSR, RESP DEEP EVEN, SKIN WARM DRY PINK.  1133 CHANGED CUFF TO RT LEG 1135CHANGED CUFF TO SMALL, PATIENT NOW ALERT AND TALKING, VOTHER VITAL SIGNS NORMAL. CHANGED TO RT ARM, BP 126/59

## 2011-04-22 ENCOUNTER — Telehealth: Payer: Self-pay | Admitting: *Deleted

## 2011-04-22 NOTE — Telephone Encounter (Signed)

## 2011-08-09 ENCOUNTER — Encounter: Payer: Self-pay | Admitting: Vascular Surgery

## 2011-08-09 ENCOUNTER — Other Ambulatory Visit (INDEPENDENT_AMBULATORY_CARE_PROVIDER_SITE_OTHER): Payer: Medicare Other | Admitting: *Deleted

## 2011-08-09 ENCOUNTER — Ambulatory Visit (INDEPENDENT_AMBULATORY_CARE_PROVIDER_SITE_OTHER): Payer: Medicare Other | Admitting: Vascular Surgery

## 2011-08-09 DIAGNOSIS — M79609 Pain in unspecified limb: Secondary | ICD-10-CM

## 2011-08-09 DIAGNOSIS — M79603 Pain in arm, unspecified: Secondary | ICD-10-CM

## 2011-08-09 DIAGNOSIS — N186 End stage renal disease: Secondary | ICD-10-CM | POA: Insufficient documentation

## 2011-08-09 DIAGNOSIS — T82898A Other specified complication of vascular prosthetic devices, implants and grafts, initial encounter: Secondary | ICD-10-CM

## 2011-08-09 NOTE — Progress Notes (Signed)
Subjective:     Patient ID: [name], female   DOB: 1946/04/11, 65 y.o.   MRN: [id number]  HPI this 65 year old female has a history of end-stage renal disease with a left upper arm AV fistula which I created in 2005. He was revised in 2007. Shortly thereafter she had a renal transplant at [facility] which continues to function well. She has not been dialyzed through the left upper arm fistula and at least 5 years. She has been having increasing pain over a prominent "knot" in the lower upper arm. She states the pain has been present for 4 or 5 years but is getting much worse.  Past Medical History  Diagnosis Date  . Blood transfusion     when on dialysis  . Cancer 2010    left breast  . Hypertension   . Diabetes mellitus   . GERD (gastroesophageal reflux disease)   . Neuromuscular disorder     lower legs  . Chronic kidney disease 2007    transplant    History  Substance Use Topics  . Smoking status: Never Smoker   . Smokeless tobacco: Never Used  . Alcohol Use: No    Family History  Problem Relation Age of Onset  . Colon cancer Neg Hx   . Esophageal cancer Neg Hx   . Stomach cancer Neg Hx     Allergies  Allergen Reactions  . Lipitor (Atorvastatin Calcium)     Myalgias    Current outpatient prescriptions:BEE POLLEN PO, Take 250 mg by mouth daily.  , Disp: , Rfl: ;  CELLCEPT 250 MG capsule, Take 2 capsules by mouth Twice daily., Disp: , Rfl: ;  fish oil-omega-3 fatty acids 1000 MG capsule, Take 2 g by mouth 2 (two) times daily.  , Disp: , Rfl: ;  gabapentin (NEURONTIN) 300 MG capsule, Take 1 capsule by mouth Daily., Disp: , Rfl: ;  GRAPE SEED EXTRACT PO, Take 2 capsules by mouth 2 (two) times daily.  , Disp: , Rfl:  insulin lispro (HUMALOG PEN) 100 UNIT/ML injection, Inject 20 Units into the skin 3 (three) times daily before meals. According to blood sugar/sliding scale , Disp: , Rfl: ;  LANTUS SOLOSTAR 100 UNIT/ML injection, Inject 60 Units into the skin daily  before breakfast., Disp: , Rfl: ;  Magnesium Oxide (MAG-OXIDE PO), Take 400 mg by mouth 2 (two) times daily.  , Disp: , Rfl: ;  NEXIUM 40 MG capsule, Take 1 tablet by mouth Daily., Disp: , Rfl:  predniSONE (DELTASONE) 5 MG tablet, Take 1 tablet by mouth daily., Disp: , Rfl: ;  sulfamethoxazole-trimethoprim (BACTRIM DS) 800-160 MG per tablet, Take 1 tablet by mouth 3 (three) times a week. M,W,F , Disp: , Rfl: ;  tacrolimus (PROGRAF) 1 MG capsule, Take 4 mg by mouth 2 (two) times daily.  , Disp: , Rfl: ;  TOPROL XL 100 MG 24 hr tablet, Take 50 mg by mouth Twice daily before meals., Disp: , Rfl:  traMADol-acetaminophen (ULTRACET) 37.5-325 MG per tablet, Take 2 tablets by mouth as needed. Rarely takes it for pain, Disp: , Rfl: ;  VITAMIN B1-B12 IJ, Inject 1 mL as directed every 30 (thirty) days.  , Disp: , Rfl: ;  Vitamin D, Ergocalciferol, (DRISDOL) 50000 UNITS CAPS, Take 50,000 Units by mouth every 30 (thirty) days.  , Disp: , Rfl:   BP 156/89  Pulse 84  Resp 16  Ht 5\' 7"  (1.702 m)  Wt 250 lb (113.399 kg)  BMI 39.16 kg/m2  SpO2 98%  Body mass index is 39.16 kg/(m^2).         Review of Systems she denies chest pain, dyspnea on exertion, PND, orthopnea, hemoptysis, lower extremity claudication symptoms. She has a history of left breast cancer. All other systems are negative and a complete review of     Objective:   Physical Exam blood pressure 156/89 heart rate 84 respirations 16 General well-developed well-nourished female no apparent stress alert and oriented x3 HEENT normal for age Lungs no rhonchi or wheezing Cardiovascular regular rhythm no murmurs carotid pulses 3+ no bruits audible Abdomen obese no palpable masses Lower extremities well perfused with 1+ edema Left upper extremity exam reveals a left brachial cephalic AV fistula with a prominent thrombosed or partially thrombosed pseudoaneurysm at the arterial anastomosis measuring 3 x 2 cm mildly to moderately tender with a second  false aneurysm measuring 2 cm in diameter about 3 cm more proximally in the upper arm. Neither of these have a strong pulsation. There is a 3+ radial pulse palpable distally. There is no erythema or blistering of the skin.  Today I ordered a venous duplex exam of the left upper arm fistula. The fistula is partially thrombosed. Both pseudoaneurysms or partially thrombosed. Assessment:    painful pseudoaneurysm involving the left upper extremity brachial cephalic AV fistula-partially thrombosed-in patient with functioning renal transplant    Plan:     Will repair left brachial artery and resected these to painful pseudoaneurysm is on Friday, 08/26/2011 under general anesthesia

## 2011-08-11 ENCOUNTER — Encounter (HOSPITAL_COMMUNITY): Payer: Self-pay | Admitting: Pharmacy Technician

## 2011-08-12 ENCOUNTER — Other Ambulatory Visit: Payer: Self-pay

## 2011-08-17 NOTE — Procedures (Unsigned)
VASCULAR LAB EXAM  INDICATION:  Left AV fistula with increasing pain and palpable mass.  HISTORY: Diabetes: Cardiac: Hypertension:  EXAM: 1. Subacutely thrombosed left brachiocephalic fistula with thrombosed     pseudoaneurysm. 2. The brachial artery appears to be within normal limits. 3. There is an aneurysmal segment in the antecubital space at the     anastomosis, measuring approximately 2.3 cm X 3.9 cm. 4. There is a thrombosed pseudoaneurysm just distal to the aneurysmal     segment measuring approximately 1.5 cm X 3.3 cm.  The remainder of     the fistula appears acutely thrombosed.  IMPRESSION:  Subacutely thrombosed left brachiocephalic fistula with thrombosed pseudoaneurysm observed.  ___________________________________________ Quita Skye. Hart Rochester, M.D.  LT/MEDQ  D:  08/09/2011  T:  08/09/2011  Job:  [id number]

## 2011-08-18 ENCOUNTER — Other Ambulatory Visit: Payer: Self-pay

## 2011-08-18 ENCOUNTER — Encounter (HOSPITAL_COMMUNITY)
Admission: RE | Admit: 2011-08-18 | Discharge: 2011-08-18 | Disposition: A | Discharge status: Home / Self Care | Payer: PRIVATE HEALTH INSURANCE | Source: Ambulatory Visit | Attending: Anesthesiology | Admitting: Anesthesiology

## 2011-08-18 ENCOUNTER — Encounter (HOSPITAL_COMMUNITY)
Admission: RE | Admit: 2011-08-18 | Discharge: 2011-08-18 | Disposition: A | Discharge status: Home / Self Care | Payer: PRIVATE HEALTH INSURANCE | Source: Ambulatory Visit | Attending: Vascular Surgery | Admitting: Vascular Surgery

## 2011-08-18 ENCOUNTER — Encounter (HOSPITAL_COMMUNITY): Payer: Self-pay

## 2011-08-18 HISTORY — DX: Acute upper respiratory infection, unspecified: J06.9

## 2011-08-18 HISTORY — DX: Sleep apnea, unspecified: G47.30

## 2011-08-18 HISTORY — DX: Unspecified osteoarthritis, unspecified site: M19.90

## 2011-08-18 LAB — SURGICAL PCR SCREEN: Staphylococcus aureus: POSITIVE — AB

## 2011-08-18 NOTE — Pre-Procedure Instructions (Signed)
20 [name]  08/18/2011   Your procedure is scheduled on:  08-26-2011  Report to [facility] at 7:00 AM.  Call this number if you have problems the morning of surgery: [phone]   Remember:   Do not eat food:After Midnight.  May have clear liquids: up to 4 Hours before arrival.  Clear liquids include soda, tea, black coffee, apple or grape juice, broth.  Take these medicines the morning of surgery with A SIP OF WATER: nexium, prednisone , toprol XL, tramadol   Do not wear jewelry, make-up or nail polish.  Do not wear lotions, powders, or perfumes. You may wear deodorant.  Do not shave 48 hours prior to surgery.  Do not bring valuables to the hospital.  Contacts, dentures or bridgework may not be worn into surgery.  Leave suitcase in the car. After surgery it may be brought to your room.  For patients admitted to the hospital, checkout time is 11:00 AM the day of discharge.   Patients discharged the day of surgery will not be allowed to drive home.  Name and phone number of your driver: Dexter Heiden  Special Instructions: CHG Shower Use Special Wash: 1/2 bottle night before surgery and 1/2 bottle morning of surgery.   Please read over the following fact sheets that you were given: Pain Booklet, Coughing and Deep Breathing, MRSA Information and Surgical Site Infection Prevention

## 2011-08-25 MED ORDER — DEXTROSE 5 % IV SOLN
1.5000 g | INTRAVENOUS | Status: AC
  Administered 2011-08-26: 1.5 g via INTRAVENOUS
  Filled 2011-08-25: qty 1.5

## 2011-08-26 ENCOUNTER — Ambulatory Visit (HOSPITAL_COMMUNITY): Payer: PRIVATE HEALTH INSURANCE | Admitting: Certified Registered"

## 2011-08-26 ENCOUNTER — Encounter (HOSPITAL_COMMUNITY): Payer: Self-pay | Admitting: Certified Registered"

## 2011-08-26 ENCOUNTER — Other Ambulatory Visit: Payer: Self-pay | Admitting: Vascular Surgery

## 2011-08-26 ENCOUNTER — Encounter (HOSPITAL_COMMUNITY)
Admission: RE | Disposition: A | Discharge status: Home / Self Care | Payer: Self-pay | Source: Ambulatory Visit | Attending: Vascular Surgery

## 2011-08-26 ENCOUNTER — Encounter (HOSPITAL_COMMUNITY): Payer: Self-pay

## 2011-08-26 ENCOUNTER — Ambulatory Visit (HOSPITAL_COMMUNITY)
Admission: RE | Admit: 2011-08-26 | Discharge: 2011-08-26 | Disposition: A | Discharge status: Home / Self Care | Payer: PRIVATE HEALTH INSURANCE | Source: Ambulatory Visit | Attending: Vascular Surgery | Admitting: Vascular Surgery

## 2011-08-26 DIAGNOSIS — K219 Gastro-esophageal reflux disease without esophagitis: Secondary | ICD-10-CM | POA: Insufficient documentation

## 2011-08-26 DIAGNOSIS — E119 Type 2 diabetes mellitus without complications: Secondary | ICD-10-CM | POA: Insufficient documentation

## 2011-08-26 DIAGNOSIS — N186 End stage renal disease: Secondary | ICD-10-CM | POA: Insufficient documentation

## 2011-08-26 DIAGNOSIS — Z940 Kidney transplant status: Secondary | ICD-10-CM | POA: Insufficient documentation

## 2011-08-26 DIAGNOSIS — Z853 Personal history of malignant neoplasm of breast: Secondary | ICD-10-CM | POA: Insufficient documentation

## 2011-08-26 DIAGNOSIS — I120 Hypertensive chronic kidney disease with stage 5 chronic kidney disease or end stage renal disease: Secondary | ICD-10-CM | POA: Insufficient documentation

## 2011-08-26 DIAGNOSIS — Y832 Surgical operation with anastomosis, bypass or graft as the cause of abnormal reaction of the patient, or of later complication, without mention of misadventure at the time of the procedure: Secondary | ICD-10-CM | POA: Insufficient documentation

## 2011-08-26 DIAGNOSIS — I721 Aneurysm of artery of upper extremity: Secondary | ICD-10-CM | POA: Insufficient documentation

## 2011-08-26 DIAGNOSIS — T82898A Other specified complication of vascular prosthetic devices, implants and grafts, initial encounter: Secondary | ICD-10-CM

## 2011-08-26 HISTORY — PX: THROMBECTOMY W/ EMBOLECTOMY: SHX2507

## 2011-08-26 LAB — POCT I-STAT 4, (NA,K, GLUC, HGB,HCT)
Glucose, Bld: 214 mg/dL — ABNORMAL HIGH (ref 70–99)
HCT: 42 % (ref 36.0–46.0)
Hemoglobin: 14.3 g/dL (ref 12.0–15.0)
Sodium: 137 mEq/L (ref 135–145)

## 2011-08-26 SURGERY — THROMBECTOMY ARTERIOVENOUS FISTULA
Anesthesia: General | Site: Arm Upper | Laterality: Left | Wound class: Clean

## 2011-08-26 MED ORDER — MEPERIDINE HCL 25 MG/ML IJ SOLN: 6.2500 mg | INTRAMUSCULAR | Status: DC | PRN

## 2011-08-26 MED ORDER — PROPOFOL 10 MG/ML IV EMUL
INTRAVENOUS | Status: DC | PRN
  Administered 2011-08-26: 150 mg via INTRAVENOUS

## 2011-08-26 MED ORDER — LIDOCAINE-EPINEPHRINE (PF) 1 %-1:200000 IJ SOLN
INTRAMUSCULAR | Status: DC | PRN
  Administered 2011-08-26: 9 mL

## 2011-08-26 MED ORDER — PROMETHAZINE HCL 25 MG/ML IJ SOLN: 6.2500 mg | INTRAMUSCULAR | Status: DC | PRN

## 2011-08-26 MED ORDER — OXYCODONE HCL 5 MG PO TABS: 5.0000 mg | ORAL_TABLET | Freq: Four times a day (QID) | ORAL | Status: DC | PRN

## 2011-08-26 MED ORDER — ONDANSETRON HCL 4 MG/2ML IJ SOLN
INTRAMUSCULAR | Status: DC | PRN
  Administered 2011-08-26: 4 mg via INTRAVENOUS

## 2011-08-26 MED ORDER — MIDAZOLAM HCL 5 MG/5ML IJ SOLN
INTRAMUSCULAR | Status: DC | PRN
  Administered 2011-08-26 (×2): 1 mg via INTRAVENOUS

## 2011-08-26 MED ORDER — LIDOCAINE HCL (CARDIAC) 20 MG/ML IV SOLN
INTRAVENOUS | Status: DC | PRN
  Administered 2011-08-26: 80 mg via INTRAVENOUS

## 2011-08-26 MED ORDER — SODIUM CHLORIDE 0.9 % IR SOLN
Status: DC | PRN
  Administered 2011-08-26: 1000 mL

## 2011-08-26 MED ORDER — FENTANYL CITRATE 0.05 MG/ML IJ SOLN
INTRAMUSCULAR | Status: DC | PRN
  Administered 2011-08-26 (×2): 50 ug via INTRAVENOUS

## 2011-08-26 MED ORDER — OXYCODONE HCL 5 MG PO TABS: 5.0000 mg | ORAL_TABLET | ORAL | Status: DC | PRN

## 2011-08-26 MED ORDER — SODIUM CHLORIDE 0.9 % IV SOLN
INTRAVENOUS | Status: DC
  Administered 2011-08-26: 07:00:00 via INTRAVENOUS

## 2011-08-26 MED ORDER — EPHEDRINE SULFATE 50 MG/ML IJ SOLN
INTRAMUSCULAR | Status: DC | PRN
  Administered 2011-08-26 (×2): 5 mg via INTRAVENOUS
  Administered 2011-08-26 (×3): 10 mg via INTRAVENOUS

## 2011-08-26 MED ORDER — SODIUM CHLORIDE 0.9 % IR SOLN
Status: DC | PRN
  Administered 2011-08-26: 08:00:00

## 2011-08-26 MED ORDER — HYDROMORPHONE HCL PF 1 MG/ML IJ SOLN
0.2500 mg | INTRAMUSCULAR | Status: DC | PRN
  Administered 2011-08-26: 0.25 mg via INTRAVENOUS

## 2011-08-26 MED ORDER — OXYCODONE HCL 5 MG PO TABS: 5.0000 mg | ORAL_TABLET | Freq: Four times a day (QID) | ORAL | Status: AC | PRN

## 2011-08-26 SURGICAL SUPPLY — 37 items
ADH SKN CLS APL DERMABOND .7 (GAUZE/BANDAGES/DRESSINGS) ×1
CANISTER SUCTION 2500CC (MISCELLANEOUS) ×2 IMPLANT
CATH EMB 5FR 80CM (CATHETERS) ×2 IMPLANT
CLIP TI MEDIUM 6 (CLIP) ×2 IMPLANT
CLIP TI WIDE RED SMALL 6 (CLIP) ×2 IMPLANT
CLOTH BEACON ORANGE TIMEOUT ST (SAFETY) ×2 IMPLANT
COVER SURGICAL LIGHT HANDLE (MISCELLANEOUS) ×4 IMPLANT
DERMABOND ADVANCED (GAUZE/BANDAGES/DRESSINGS) ×1
DERMABOND ADVANCED .7 DNX12 (GAUZE/BANDAGES/DRESSINGS) ×1 IMPLANT
ELECT REM PT RETURN 9FT ADLT (ELECTROSURGICAL) ×2
ELECTRODE REM PT RTRN 9FT ADLT (ELECTROSURGICAL) ×1 IMPLANT
GEL ULTRASOUND 20GR AQUASONIC (MISCELLANEOUS) IMPLANT
GLOVE BIO SURGEON STRL SZ 6.5 (GLOVE) ×3 IMPLANT
GLOVE BIOGEL PI IND STRL 7.0 (GLOVE) IMPLANT
GLOVE BIOGEL PI IND STRL 7.5 (GLOVE) IMPLANT
GLOVE BIOGEL PI INDICATOR 7.0 (GLOVE) ×2
GLOVE BIOGEL PI INDICATOR 7.5 (GLOVE) ×1
GLOVE SS BIOGEL STRL SZ 7 (GLOVE) ×1 IMPLANT
GLOVE SUPERSENSE BIOGEL SZ 7 (GLOVE) ×1
GLOVE SURG SS PI 7.5 STRL IVOR (GLOVE) ×1 IMPLANT
GOWN PREVENTION PLUS XLARGE (GOWN DISPOSABLE) ×1 IMPLANT
GOWN STRL NON-REIN LRG LVL3 (GOWN DISPOSABLE) ×4 IMPLANT
KIT BASIN OR (CUSTOM PROCEDURE TRAY) ×2 IMPLANT
KIT ROOM TURNOVER OR (KITS) ×2 IMPLANT
NS IRRIG 1000ML POUR BTL (IV SOLUTION) ×2 IMPLANT
PACK CV ACCESS (CUSTOM PROCEDURE TRAY) ×2 IMPLANT
PAD ARMBOARD 7.5X6 YLW CONV (MISCELLANEOUS) ×4 IMPLANT
SPONGE GAUZE 4X4 12PLY (GAUZE/BANDAGES/DRESSINGS) ×2 IMPLANT
SUT PROLENE 6 0 BV (SUTURE) ×3 IMPLANT
SUT SILK 1 TIES 10X30 (SUTURE) ×1 IMPLANT
SUT VIC AB 3-0 SH 27 (SUTURE) ×4
SUT VIC AB 3-0 SH 27X BRD (SUTURE) ×1 IMPLANT
SUT VICRYL 4-0 PS2 18IN ABS (SUTURE) ×1 IMPLANT
TOWEL OR 17X24 6PK STRL BLUE (TOWEL DISPOSABLE) ×2 IMPLANT
TOWEL OR 17X26 10 PK STRL BLUE (TOWEL DISPOSABLE) ×2 IMPLANT
UNDERPAD 30X30 INCONTINENT (UNDERPADS AND DIAPERS) ×2 IMPLANT
WATER STERILE IRR 1000ML POUR (IV SOLUTION) ×2 IMPLANT

## 2011-08-26 NOTE — H&P (View-Only) (Signed)
Subjective:     Patient ID: [name], female   DOB: 06/04/1946, 66 y.o.   MRN: [id number]  HPI this 66-year-old female has a history of end-stage renal disease with a left upper arm AV fistula which I created in 2005. He was revised in 2007. Shortly thereafter she had a renal transplant at [facility] which continues to function well. She has not been dialyzed through the left upper arm fistula and at least 5 years. She has been having increasing pain over a prominent "knot" in the lower upper arm. She states the pain has been present for 4 or 5 years but is getting much worse.  Past Medical History  Diagnosis Date  . Blood transfusion     when on dialysis  . Cancer 2010    left breast  . Hypertension   . Diabetes mellitus   . GERD (gastroesophageal reflux disease)   . Neuromuscular disorder     lower legs  . Chronic kidney disease 2007    transplant    History  Substance Use Topics  . Smoking status: Never Smoker   . Smokeless tobacco: Never Used  . Alcohol Use: No    Family History  Problem Relation Age of Onset  . Colon cancer Neg Hx   . Esophageal cancer Neg Hx   . Stomach cancer Neg Hx     Allergies  Allergen Reactions  . Lipitor (Atorvastatin Calcium)     Myalgias    Current outpatient prescriptions:BEE POLLEN PO, Take 250 mg by mouth daily.  , Disp: , Rfl: ;  CELLCEPT 250 MG capsule, Take 2 capsules by mouth Twice daily., Disp: , Rfl: ;  fish oil-omega-3 fatty acids 1000 MG capsule, Take 2 g by mouth 2 (two) times daily.  , Disp: , Rfl: ;  gabapentin (NEURONTIN) 300 MG capsule, Take 1 capsule by mouth Daily., Disp: , Rfl: ;  GRAPE SEED EXTRACT PO, Take 2 capsules by mouth 2 (two) times daily.  , Disp: , Rfl:  insulin lispro (HUMALOG PEN) 100 UNIT/ML injection, Inject 20 Units into the skin 3 (three) times daily before meals. According to blood sugar/sliding scale , Disp: , Rfl: ;  LANTUS SOLOSTAR 100 UNIT/ML injection, Inject 60 Units into the skin daily  before breakfast., Disp: , Rfl: ;  Magnesium Oxide (MAG-OXIDE PO), Take 400 mg by mouth 2 (two) times daily.  , Disp: , Rfl: ;  NEXIUM 40 MG capsule, Take 1 tablet by mouth Daily., Disp: , Rfl:  predniSONE (DELTASONE) 5 MG tablet, Take 1 tablet by mouth daily., Disp: , Rfl: ;  sulfamethoxazole-trimethoprim (BACTRIM DS) 800-160 MG per tablet, Take 1 tablet by mouth 3 (three) times a week. M,W,F , Disp: , Rfl: ;  tacrolimus (PROGRAF) 1 MG capsule, Take 4 mg by mouth 2 (two) times daily.  , Disp: , Rfl: ;  TOPROL XL 100 MG 24 hr tablet, Take 50 mg by mouth Twice daily before meals., Disp: , Rfl:  traMADol-acetaminophen (ULTRACET) 37.5-325 MG per tablet, Take 2 tablets by mouth as needed. Rarely takes it for pain, Disp: , Rfl: ;  VITAMIN B1-B12 IJ, Inject 1 mL as directed every 30 (thirty) days.  , Disp: , Rfl: ;  Vitamin D, Ergocalciferol, (DRISDOL) 50000 UNITS CAPS, Take 50,000 Units by mouth every 30 (thirty) days.  , Disp: , Rfl:   BP 156/89  Pulse 84  Resp 16  Ht 5' 7" (1.702 m)  Wt 250 lb (113.399 kg)  BMI 39.16 kg/m2    SpO2 98%  Body mass index is 39.16 kg/(m^2).         Review of Systems she denies chest pain, dyspnea on exertion, PND, orthopnea, hemoptysis, lower extremity claudication symptoms. She has a history of left breast cancer. All other systems are negative and a complete review of     Objective:   Physical Exam blood pressure 156/89 heart rate 84 respirations 16 General well-developed well-nourished female no apparent stress alert and oriented x3 HEENT normal for age Lungs no rhonchi or wheezing Cardiovascular regular rhythm no murmurs carotid pulses 3+ no bruits audible Abdomen obese no palpable masses Lower extremities well perfused with 1+ edema Left upper extremity exam reveals a left brachial cephalic AV fistula with a prominent thrombosed or partially thrombosed pseudoaneurysm at the arterial anastomosis measuring 3 x 2 cm mildly to moderately tender with a second  false aneurysm measuring 2 cm in diameter about 3 cm more proximally in the upper arm. Neither of these have a strong pulsation. There is a 3+ radial pulse palpable distally. There is no erythema or blistering of the skin.  Today I ordered a venous duplex exam of the left upper arm fistula. The fistula is partially thrombosed. Both pseudoaneurysms or partially thrombosed. Assessment:    painful pseudoaneurysm involving the left upper extremity brachial cephalic AV fistula-partially thrombosed-in patient with functioning renal transplant    Plan:     Will repair left brachial artery and resected these to painful pseudoaneurysm is on Friday, 08/26/2011 under general anesthesia      

## 2011-08-26 NOTE — Op Note (Signed)
OPERATIVE REPORT  Date of Surgery: 08/26/2011  Surgeon: Josephina Gip, MD  Assistant: Newton Pigg PA  Pre-op Diagnosis: Left Arm Thrombosed Pseudoaneurysm Arteriovenous Fistula-painful x2  Post-op Diagnosis: Same  Procedure: Procedure(s): Resection of 2 painful thrombosed pseudoaneurysms of left upper arm AV fistula with repair left brachial artery Anesthesia: Gen. endotracheal  EBL: Minimal  Complications: None  Procedure Details: Pressure setting the operating room placed in supine position at which time satisfactory general endotracheal anesthesia was administered. Left upper extremity was prepped with Betadine scrub and solution draped in routine sterile manner. Anesthesia was supplemented with 1% Xylocaine with epinephrine. There was a thrombosed pseudoaneurysm involving the arterial anastomosis the brachial artery in the distal upper arm. The aneurysm at this location measured approximately 3 cm in diameter with a second pseudoaneurysm which was thrombosed about 4 cm proximal to this point. Transverse incision was made through the previous scar and the large pseudoaneurysm was dissected distally and ligated with #1 silk tie. The aneurysm was mobilized down to its anastomosis to the brachial artery. Proximal and distal control was obtained. Artery was clamped temporarily proximally and distally and the pseudoaneurysm dissected down to the neck which was transected. A small cuff was left in place oversewn with 2 layers of 6-0 Prolene. There was an excellent pulse and Doppler flow in the radial artery following this procedure. Attention was turned to the second pseudoaneurysm which was similarly excised through short transverse incision. This measured about 2 x 1 cm in diameter and was thrombosed. After this was completed adequate hemostasis was achieved in both wounds closed in layers with Vicryl in a subcuticular fashion with Dermabond patient taken to the recovery room in stable  condition   Josephina Gip, MD 08/26/2011 8:48 AM

## 2011-08-26 NOTE — Interval H&P Note (Signed)
History and Physical Interval Note:  08/26/2011 7:31 AM  [name]  has presented today for surgery, with the diagnosis of End Stage Renal Disease  The various methods of treatment have been discussed with the patient and family. After consideration of risks, benefits and other options for treatment, the patient has consented to  Procedure(s): THROMBECTOMY ARTERIOVENOUS FISTULA as a surgical intervention .  The patients' history has been reviewed, patient examined, no change in status, stable for surgery.  I have reviewed the patients' chart and labs.  Questions were answered to the patient's satisfaction.     LAWSON,JAMES D

## 2011-08-26 NOTE — Transfer of Care (Signed)
Immediate Anesthesia Transfer of Care Note  Patient: [name]  Procedure(s) Performed:  THROMBECTOMY ARTERIOVENOUS FISTULA - Excision of Two Pseudoaneurysms of Basilic-Cephalic Arteriovenous Fistula; Repair of Brachial Artery  Patient Location: PACU  Anesthesia Type: General  Level of Consciousness: awake, alert  and oriented  Airway & Oxygen Therapy: Patient Spontanous Breathing and Patient connected to nasal cannula oxygen  Post-op Assessment: Report given to PACU RN  Post vital signs: Reviewed and stable  Complications: No apparent anesthesia complications

## 2011-08-26 NOTE — Anesthesia Postprocedure Evaluation (Signed)
  Anesthesia Post-op Note  Patient: [name]  Procedure(s) Performed:  THROMBECTOMY ARTERIOVENOUS FISTULA - Excision of Two Pseudoaneurysms of Basilic-Cephalic Arteriovenous Fistula; Repair of Brachial Artery  Patient Location: PACU  Anesthesia Type: General  Level of Consciousness: awake, alert  and oriented  Airway and Oxygen Therapy: Patient Spontanous Breathing  Post-op Pain: mild  Post-op Assessment: Post-op Vital signs reviewed, Patient's Cardiovascular Status Stable, Respiratory Function Stable and Patent Airway  Post-op Vital Signs: Reviewed and stable  Complications: No apparent anesthesia complications

## 2011-08-26 NOTE — Preoperative (Signed)
Beta Blockers   Reason not to administer Beta Blockers:Not Applicable 

## 2011-08-26 NOTE — Anesthesia Preprocedure Evaluation (Addendum)
Anesthesia Evaluation   Patient awake    Reviewed: Allergy & Precautions, H&P , NPO status   Airway Mallampati: III TM Distance: >3 FB Neck ROM: Full    Dental  (+) Teeth Intact and Dental Advisory Given   Pulmonary Recent URI ,          Cardiovascular hypertension, Pt. on medications     Neuro/Psych  Neuromuscular disease    GI/Hepatic GERD-  Medicated,  Endo/Other  Diabetes mellitus-, Type 2  Renal/GU      Musculoskeletal   Abdominal   Peds  Hematology   Anesthesia Other Findings   Reproductive/Obstetrics                          Anesthesia Physical Anesthesia Plan  ASA: III  Anesthesia Plan: General   Post-op Pain Management:    Induction: Intravenous  Airway Management Planned: LMA  Additional Equipment:   Intra-op Plan:   Post-operative Plan: Extubation in OR  Informed Consent: I have reviewed the patients History and Physical, chart, labs and discussed the procedure including the risks, benefits and alternatives for the proposed anesthesia with the patient or authorized representative who has indicated his/her understanding and acceptance.     Plan Discussed with: CRNA  Anesthesia Plan Comments:         Anesthesia Quick Evaluation

## 2011-08-29 ENCOUNTER — Encounter (HOSPITAL_COMMUNITY): Payer: Self-pay | Admitting: Vascular Surgery

## 2011-09-06 ENCOUNTER — Ambulatory Visit: Payer: Medicare Other | Admitting: Vascular Surgery

## 2011-09-12 ENCOUNTER — Encounter: Payer: Self-pay | Admitting: Vascular Surgery

## 2011-09-13 ENCOUNTER — Ambulatory Visit (INDEPENDENT_AMBULATORY_CARE_PROVIDER_SITE_OTHER): Payer: Medicare PPO | Admitting: Vascular Surgery

## 2011-09-13 ENCOUNTER — Encounter: Payer: Self-pay | Admitting: Vascular Surgery

## 2011-09-13 VITALS — BP 148/58 | HR 83 | Resp 18 | Ht 67.0 in | Wt 244.0 lb

## 2011-09-13 DIAGNOSIS — N186 End stage renal disease: Secondary | ICD-10-CM

## 2011-09-13 DIAGNOSIS — I729 Aneurysm of unspecified site: Secondary | ICD-10-CM

## 2011-09-13 NOTE — Progress Notes (Signed)
Subjective:     Patient ID: [name], female   DOB: 1945-09-14, 66 y.o.   MRN: [id number]  HPI this 66 year old female returns for followup regarding excision of 2 thrombosed pseudoaneurysms from a left upper arm AV fistula with repair of left brachial artery. Patient has a functioning renal transplant and no longer needs vascular access. She denies any pain or numbness in the left hand and states that the left arm feels much better.  Review of Systems     Objective:   Physical ExamBP 148/58  Pulse 83  Resp 18  Ht 5\' 7"  (1.702 m)  Wt 244 lb (110.678 kg)  BMI 38.22 kg/m2 General well-developed well-nourished female in no apparent distress alert and oriented x3 he is in a wheelchair Left upper extremity 3+ brachial and radial pulse palpable good strength distally. Upper arm incisions have healed nicely with no evidence of infection.    Assessment:     Doing well post excision of 2 thrombosed pseudoaneurysms and repair of left brachial artery no longer needs vascular access    Plan:     Return on when necessary basis

## 2011-10-28 ENCOUNTER — Encounter (INDEPENDENT_AMBULATORY_CARE_PROVIDER_SITE_OTHER): Payer: Self-pay | Admitting: General Surgery

## 2012-07-13 ENCOUNTER — Encounter (INDEPENDENT_AMBULATORY_CARE_PROVIDER_SITE_OTHER): Payer: Self-pay

## 2014-02-11 ENCOUNTER — Telehealth: Payer: Self-pay | Admitting: Gastroenterology

## 2014-02-11 NOTE — Telephone Encounter (Signed)
Patient with a 12 day history of diarrhea, multiple stools a day.  She reports that she is also having incontinent episodes.  She will come in and see Dr. Fuller Plan tomorrow at 10:45

## 2014-02-12 ENCOUNTER — Other Ambulatory Visit: Payer: PRIVATE HEALTH INSURANCE

## 2014-02-12 ENCOUNTER — Ambulatory Visit (INDEPENDENT_AMBULATORY_CARE_PROVIDER_SITE_OTHER): Payer: PRIVATE HEALTH INSURANCE | Admitting: Gastroenterology

## 2014-02-12 ENCOUNTER — Encounter: Payer: Self-pay | Admitting: Gastroenterology

## 2014-02-12 VITALS — BP 120/60 | HR 72 | Ht 67.0 in | Wt 254.0 lb

## 2014-02-12 DIAGNOSIS — R197 Diarrhea, unspecified: Secondary | ICD-10-CM

## 2014-02-12 NOTE — Progress Notes (Addendum)
    History of Present Illness: This is a 68 year old female accompanied by her husband. She relates a history of watery urgent dark diarrhea occurring up to 10 times a day for about the past 2 weeks. She notes no melena or hematochezia she has had crampy right lower quadrant pain. She initially had nausea and vomiting for 1 or 2 days but this abated. She denies any recent medication changes. She was recently evaluated by Dr. Teressa Lower and Dr. Edrick Oh. She also had an abdominal/pelvic CT scan done at [facility] on 6/17. Unfortunately I do not have any information from these recent evaluations available at this time. She is maintained on immunosuppressants and chronic Bactrim status post renal transplant. Colonoscopy in August 2012 was normal.  Current Medications, Allergies, Past Medical History, Past Surgical History, Family History and Social History were reviewed in [employer] record.  Physical Exam: General: Well developed , well nourished, fatigued, chronically ill-appearing, in a wheelchair, no acute distress Head: Normocephalic and atraumatic Eyes:  sclerae anicteric, EOMI Ears: Normal auditory acuity Mouth: No deformity or lesions Lungs: Clear throughout to auscultation Heart: Regular rate and rhythm; no murmurs, rubs or bruits Abdomen: Soft, large, mild RLQ tenderness and non distended. No masses, hepatosplenomegaly or hernias noted. Normal Bowel sounds Musculoskeletal: Symmetrical with no gross deformities  Pulses:  Normal pulses noted Extremities: No clubbing, cyanosis, edema or deformities noted Neurological: Alert oriented x 4, grossly nonfocal Psychological:  Alert and cooperative. Normal mood and affect  Assessment and Recommendations:  1. Acute diarrheal illness. Presumed infectious etiology. Obtain a GI pathogen panel and request records as referenced in the HPI. Advised to maintain a BRATT diet and push oral fluids. Imodium 3 times a day  as needed and Kaopectate 3 times a day as needed for now.   02/12/2014 abd/pelvic CT without IV contrast from [facility] on 6/17 shows cholelithiasis and right renal pelvis and ureter dilation down to the level of the transplanted kidney, unchanged.

## 2014-02-12 NOTE — Patient Instructions (Signed)
Your physician has requested that you go to the basement for the following lab work before leaving today: GI pathogen panel.  Take over the counter Imodium three times a day and Kaopectate three times a day for diarrhea.   Thank you for choosing me and [facility].  Pricilla Riffle. Dagoberto Ligas., MD., [facility]  cc: Teressa Lower, MD

## 2014-02-13 LAB — GASTROINTESTINAL PATHOGEN PANEL PCR
C. difficile Tox A/B, PCR: NEGATIVE
CAMPYLOBACTER, PCR: NEGATIVE
CRYPTOSPORIDIUM, PCR: NEGATIVE
E COLI (ETEC) LT/ST, PCR: NEGATIVE
E COLI 0157, PCR: NEGATIVE
E coli (STEC) stx1/stx2, PCR: NEGATIVE
Giardia lamblia, PCR: NEGATIVE
Norovirus, PCR: NEGATIVE
Rotavirus A, PCR: NEGATIVE
SHIGELLA, PCR: NEGATIVE
Salmonella, PCR: NEGATIVE

## 2014-02-14 ENCOUNTER — Telehealth: Payer: Self-pay | Admitting: Gastroenterology

## 2014-02-14 MED ORDER — RIFAXIMIN 550 MG PO TABS: 550.0000 mg | ORAL_TABLET | Freq: Two times a day (BID) | ORAL | Status: DC

## 2014-02-14 NOTE — Telephone Encounter (Signed)
Spoke with pt regarding results. See result note.

## 2014-09-22 DIAGNOSIS — N189 Chronic kidney disease, unspecified: Secondary | ICD-10-CM | POA: Diagnosis not present

## 2014-09-22 DIAGNOSIS — E1122 Type 2 diabetes mellitus with diabetic chronic kidney disease: Secondary | ICD-10-CM | POA: Diagnosis not present

## 2014-09-22 DIAGNOSIS — R531 Weakness: Secondary | ICD-10-CM | POA: Diagnosis not present

## 2014-09-22 DIAGNOSIS — R260 Ataxic gait: Secondary | ICD-10-CM | POA: Diagnosis not present

## 2014-09-22 DIAGNOSIS — M545 Low back pain: Secondary | ICD-10-CM | POA: Diagnosis not present

## 2014-09-22 DIAGNOSIS — Z794 Long term (current) use of insulin: Secondary | ICD-10-CM | POA: Diagnosis not present

## 2014-09-24 DIAGNOSIS — N189 Chronic kidney disease, unspecified: Secondary | ICD-10-CM | POA: Diagnosis not present

## 2014-09-24 DIAGNOSIS — M545 Low back pain: Secondary | ICD-10-CM | POA: Diagnosis not present

## 2014-09-24 DIAGNOSIS — R531 Weakness: Secondary | ICD-10-CM | POA: Diagnosis not present

## 2014-09-24 DIAGNOSIS — Z794 Long term (current) use of insulin: Secondary | ICD-10-CM | POA: Diagnosis not present

## 2014-09-24 DIAGNOSIS — E1122 Type 2 diabetes mellitus with diabetic chronic kidney disease: Secondary | ICD-10-CM | POA: Diagnosis not present

## 2014-09-24 DIAGNOSIS — R260 Ataxic gait: Secondary | ICD-10-CM | POA: Diagnosis not present

## 2014-10-08 ENCOUNTER — Telehealth: Payer: Self-pay | Admitting: Gastroenterology

## 2014-10-08 NOTE — Telephone Encounter (Signed)
Patient report that she is having terrible heartburn.  "it burns like fire".  She reports that she has been taking OTC Nexium.  Her symptoms started 3 days ago and she states she is not able to tolerate any foods due to the "burning".  Patient instructed to maintain an anti-reflux diet. Advised to avoid caffeine, mint, citrus foods/juices, tomatoes,  chocolate, NSAIDS/ASA products.  Instructed not to eat within 2 hours of exercise or bed, multiple small meals are better than 3 large meals.  Need to take PPI 30 minutes prior to 1st meal of the day. She will come in and see Tye Savoy RNP tomorrow at 2:00

## 2014-10-09 ENCOUNTER — Encounter: Payer: Self-pay | Admitting: Nurse Practitioner

## 2014-10-09 ENCOUNTER — Ambulatory Visit (INDEPENDENT_AMBULATORY_CARE_PROVIDER_SITE_OTHER): Payer: Medicare Other | Admitting: Nurse Practitioner

## 2014-10-09 ENCOUNTER — Other Ambulatory Visit (INDEPENDENT_AMBULATORY_CARE_PROVIDER_SITE_OTHER): Payer: Medicare Other

## 2014-10-09 VITALS — BP 150/72 | HR 96 | Ht 67.0 in | Wt 228.2 lb

## 2014-10-09 DIAGNOSIS — R1310 Dysphagia, unspecified: Secondary | ICD-10-CM | POA: Diagnosis not present

## 2014-10-09 DIAGNOSIS — R1314 Dysphagia, pharyngoesophageal phase: Secondary | ICD-10-CM

## 2014-10-09 LAB — BASIC METABOLIC PANEL
BUN: 7 mg/dL (ref 6–23)
CHLORIDE: 98 meq/L (ref 96–112)
CO2: 25 mEq/L (ref 19–32)
Calcium: 9.6 mg/dL (ref 8.4–10.5)
Creatinine, Ser: 0.71 mg/dL (ref 0.40–1.20)
GFR: 105.6 mL/min (ref >60.00)
Glucose, Bld: 240 mg/dL — ABNORMAL HIGH (ref 70–99)
POTASSIUM: 4 meq/L (ref 3.5–5.1)
SODIUM: 131 meq/L — AB (ref 135–145)

## 2014-10-09 MED ORDER — FLUCONAZOLE 100 MG PO TABS: ORAL_TABLET | ORAL | Status: DC

## 2014-10-09 MED ORDER — PANTOPRAZOLE SODIUM 40 MG PO TBEC: 40.0000 mg | DELAYED_RELEASE_TABLET | Freq: Two times a day (BID) | ORAL | Status: DC

## 2014-10-09 MED ORDER — LIDOCAINE VISCOUS 2 % MT SOLN: 10.0000 mL | Freq: Three times a day (TID) | OROMUCOSAL | Status: AC

## 2014-10-09 NOTE — Progress Notes (Signed)
I have discussed this case and elected to schedule EGD tomorrow given precaious situation of renal transplant with odynophagia and not taking medications. Gatha Mayer, MD, [facility]

## 2014-10-09 NOTE — Progress Notes (Signed)
     History of Present Illness:   Patient is a 69 year old female known to Dr. Fuller Plan. She is status post renal transplant , on chronic immunosuppressants. Patient was seen this past summer for acute diarrhea.   Patient is worked in today for evaluation of reflux symptoms. Over the last month or so Nexium has not been controlling her heartburn. 5 days ago patient developed severe odynophagia. She is having burning in her chest despite whether or not she takes by mouth intake. The pain was swallowing is so significant that patient has not eaten nor taken any of her medications in 5 days. Because patient is basically in by mouth except for water, she is also not taking her insulin. Patient's sister gave her Protonix, it did seem to ease the burning.   Current Medications, Allergies, Past Medical History, Past Surgical History, Family History and Social History were reviewed in [employer] record. Physical Exam: General: Pleasant, well developed , black female in a wheelchair.  Head: Normocephalic and atraumatic Eyes:  sclerae anicteric, conjunctiva pink  Ears: Normal auditory acuity Lungs: Clear throughout to auscultation Heart: Regular rate and rhythm Abdomen: Limited exam and wheelchair . Abdomen soft, non distended, non-tender. Musculoskeletal: Symmetrical with no gross deformities  Extremities: No edema  Neurological: Alert oriented x 4, grossly nonfocal Psychological:  Alert and cooperative. Normal mood and affect  Assessment and Recommendations:  69 year old female with a several week history of increasing reflux symptoms, burning in chest. Now with 5 day history of severe pain with swallowing. Pain hasn't been able to tolerate any solids nor home medications in 5 days. This is particularly concerning as patient is on antirejection medications for history of renal transplant.    Will prescribe lidocaine viscous so that patient can hopefully restart her  medications ASAP.   She would like to try Protonix so we will call that in, twice a day for now.   I don't think we can wait to see whether she would respond to empirical treatment for what could be candida or viral esophagitis. In Dr. Lynne Leader absence Dr. Carlean Purl kindly agreed to proceed with an upper  endoscopy tomorrow. The benefits, risks, and potential  complications of EGD with possible biopsies        were discussed with the patient and she agrees to                 proceed.   Check renal function

## 2014-10-09 NOTE — Patient Instructions (Signed)
Your physician has requested that you go to the basement for the following lab work before leaving today:Bmet.  We have sent the following medications to your pharmacy for you to pick up at your convenience:Viscous lidocaine and Protonix increase twice daily.   You have been scheduled for an endoscopy. Please follow written instructions given to you at your visit today. If you use inhalers (even only as needed), please bring them with you on the day of your procedure.

## 2014-10-10 ENCOUNTER — Encounter (HOSPITAL_COMMUNITY): Payer: Self-pay | Admitting: Internal Medicine

## 2014-10-10 ENCOUNTER — Ambulatory Visit (HOSPITAL_COMMUNITY)
Admission: RE | Admit: 2014-10-10 | Discharge: 2014-10-10 | Disposition: A | Discharge status: Home / Self Care | Payer: Medicare Other | Source: Ambulatory Visit | Attending: Internal Medicine | Admitting: Internal Medicine

## 2014-10-10 ENCOUNTER — Encounter (HOSPITAL_COMMUNITY)
Admission: RE | Disposition: A | Discharge status: Home / Self Care | Payer: Self-pay | Source: Ambulatory Visit | Attending: Internal Medicine

## 2014-10-10 DIAGNOSIS — R1310 Dysphagia, unspecified: Secondary | ICD-10-CM | POA: Insufficient documentation

## 2014-10-10 DIAGNOSIS — B3781 Candidal esophagitis: Secondary | ICD-10-CM | POA: Diagnosis not present

## 2014-10-10 DIAGNOSIS — K2210 Ulcer of esophagus without bleeding: Secondary | ICD-10-CM | POA: Insufficient documentation

## 2014-10-10 DIAGNOSIS — R1314 Dysphagia, pharyngoesophageal phase: Secondary | ICD-10-CM | POA: Insufficient documentation

## 2014-10-10 HISTORY — PX: ESOPHAGOGASTRODUODENOSCOPY: SHX5428

## 2014-10-10 LAB — GLUCOSE, CAPILLARY: GLUCOSE-CAPILLARY: 145 mg/dL — AB (ref 70–99)

## 2014-10-10 SURGERY — EGD (ESOPHAGOGASTRODUODENOSCOPY)
Anesthesia: Moderate Sedation

## 2014-10-10 MED ORDER — BUTAMBEN-TETRACAINE-BENZOCAINE 2-2-14 % EX AERO
INHALATION_SPRAY | CUTANEOUS | Status: DC | PRN
  Administered 2014-10-10: 2 via TOPICAL

## 2014-10-10 MED ORDER — MIDAZOLAM HCL 10 MG/2ML IJ SOLN
INTRAMUSCULAR | Status: DC | PRN
  Administered 2014-10-10: 2 mg via INTRAVENOUS
  Administered 2014-10-10: 1 mg via INTRAVENOUS
  Administered 2014-10-10 (×2): 2 mg via INTRAVENOUS

## 2014-10-10 MED ORDER — SODIUM CHLORIDE 0.9 % IV SOLN: INTRAVENOUS | Status: DC

## 2014-10-10 MED ORDER — DIPHENHYDRAMINE HCL 50 MG/ML IJ SOLN
INTRAMUSCULAR | Status: AC
  Filled 2014-10-10: qty 1

## 2014-10-10 MED ORDER — FENTANYL CITRATE 0.05 MG/ML IJ SOLN
INTRAMUSCULAR | Status: DC | PRN
  Administered 2014-10-10 (×2): 25 ug via INTRAVENOUS

## 2014-10-10 MED ORDER — FLUCONAZOLE 100 MG PO TABS: ORAL_TABLET | ORAL | Status: AC

## 2014-10-10 MED ORDER — MIDAZOLAM HCL 10 MG/2ML IJ SOLN
INTRAMUSCULAR | Status: AC
  Filled 2014-10-10: qty 2

## 2014-10-10 MED ORDER — FENTANYL CITRATE 0.05 MG/ML IJ SOLN
INTRAMUSCULAR | Status: AC
  Filled 2014-10-10: qty 2

## 2014-10-10 NOTE — Op Note (Signed)
[place], [id number]   ENDOSCOPY PROCEDURE REPORT  PATIENT: [name], [name]  MR#: [id number] BIRTHDATE: 1946-05-12 , 83  yrs. old GENDER: female ENDOSCOPIST: Gatha Mayer, MD, [facility] PROCEDURE DATE:  10/10/2014 PROCEDURE:  EGD w/ biopsy ASA CLASS:     Class III INDICATIONS:  odynophagia. MEDICATIONS: Fentanyl 50 mcg IV and Versed 7 mg IV TOPICAL ANESTHETIC: Cetacaine Spray  DESCRIPTION OF PROCEDURE: After the risks benefits and alternatives of the procedure were thoroughly explained, informed consent was obtained.  The EG [id number] ( [id number] ) endoscope was introduced through the mouth and advanced to the second portion of the duodenum , Without limitations.  The instrument was slowly withdrawn as the mucosa was fully examined.  1) White plaques that look like Candida in mid esophagus and proximal esophagus.  Brushings done. 2) Several mid esophageal ulcers, ovoid/irregular - superficial and clean-based .  Biopsies and brushings done. 3) Some fluid and food retention in stomach, slightly limited the proximal gastric view. 4) Otherwise normal.         The scope was then withdrawn from the patient and the procedure completed.  COMPLICATIONS: There were no immediate complications.  ENDOSCOPIC IMPRESSION: 1) White plaques that look like Candida in mid esophagus and proximal esophagus.  Brushings done. 2) Several mid esophageal ulcers, ovoid/irregular - superficial and clean-based .  Biopsies and brushings done. 3) Some fluid and food retention in stomach, slightly limited the proximal gastric view. 4) Otherwise normal  RECOMMENDATIONS: 1) Fluconazole 100 mg qd x 21d 2) Await bx 3) Push liquids and take meds.  Use Viscous xylocaine.    eSigned:  Gatha Mayer, MD, [facility] 10/10/2014 3:17 PM    CC: The Patient, Teressa Lower, MD , Edrick Oh, MD

## 2014-10-10 NOTE — Progress Notes (Signed)
Reviewed and agree with management plan.  Malcolm T. Stark, MD FACG 

## 2014-10-10 NOTE — Interval H&P Note (Signed)
History and Physical Interval Note:  10/10/2014 2:11 PM  [name]  has presented today for surgery, with the diagnosis of Dysphagia  The various methods of treatment have been discussed with the patient and family. After consideration of risks, benefits and other options for treatment, the patient has consented to  Procedure(s): ESOPHAGOGASTRODUODENOSCOPY (EGD) (N/A) as a surgical intervention .  The patient's history has been reviewed, patient examined, no change in status, stable for surgery.  I have reviewed the patient's chart and labs.  Questions were answered to the patient's satisfaction.     Silvano Rusk

## 2014-10-10 NOTE — H&P (View-Only) (Signed)
     History of Present Illness:   Patient is a 69 year old female known to Dr. Fuller Plan. She is status post renal transplant , on chronic immunosuppressants. Patient was seen this past summer for acute diarrhea.   Patient is worked in today for evaluation of reflux symptoms. Over the last month or so Nexium has not been controlling her heartburn. 5 days ago patient developed severe odynophagia. She is having burning in her chest despite whether or not she takes by mouth intake. The pain was swallowing is so significant that patient has not eaten nor taken any of her medications in 5 days. Because patient is basically in by mouth except for water, she is also not taking her insulin. Patient's sister gave her Protonix, it did seem to ease the burning.   Current Medications, Allergies, Past Medical History, Past Surgical History, Family History and Social History were reviewed in [employer] record. Physical Exam: General: Pleasant, well developed , black female in a wheelchair.  Head: Normocephalic and atraumatic Eyes:  sclerae anicteric, conjunctiva pink  Ears: Normal auditory acuity Lungs: Clear throughout to auscultation Heart: Regular rate and rhythm Abdomen: Limited exam and wheelchair . Abdomen soft, non distended, non-tender. Musculoskeletal: Symmetrical with no gross deformities  Extremities: No edema  Neurological: Alert oriented x 4, grossly nonfocal Psychological:  Alert and cooperative. Normal mood and affect  Assessment and Recommendations:  69 year old female with a several week history of increasing reflux symptoms, burning in chest. Now with 5 day history of severe pain with swallowing. Pain hasn't been able to tolerate any solids nor home medications in 5 days. This is particularly concerning as patient is on antirejection medications for history of renal transplant.    Will prescribe lidocaine viscous so that patient can hopefully restart her  medications ASAP.   She would like to try Protonix so we will call that in, twice a day for now.   I don't think we can wait to see whether she would respond to empirical treatment for what could be candida or viral esophagitis. In Dr. Lynne Leader absence Dr. Carlean Purl kindly agreed to proceed with an upper  endoscopy tomorrow. The benefits, risks, and potential  complications of EGD with possible biopsies        were discussed with the patient and she agrees to                 proceed.   Check renal function

## 2014-10-10 NOTE — Discharge Instructions (Addendum)
° ° ° °  There were ulcers and signs of Candida in the esophagus so I will retreat with fluconazole. Once biopsies return may need other treatment. Please keep using xylocaine and taking medications and fluids.  I appreciate the opportunity to care for you. Gatha Mayer, MD, FACG   YOU HAD AN ENDOSCOPIC PROCEDURE TODAY: Refer to the procedure report and other information in the discharge instructions given to you for any specific questions about what was found during the examination. If this information does not answer your questions, please call Dr. Celesta Aver office at [phone] to clarify.   YOU SHOULD EXPECT: Some feelings of bloating in the abdomen. Passage of more gas than usual. Walking can help get rid of the air that was put into your GI tract during the procedure and reduce the bloating. If you had a lower endoscopy (such as a colonoscopy or flexible sigmoidoscopy) you may notice spotting of blood in your stool or on the toilet paper. Some abdominal soreness may be present for a day or two, also.  DIET: Your first meal following the procedure should be a light meal and then it is ok to progress to your normal diet. A half-sandwich or bowl of soup is an example of a good first meal. Heavy or fried foods are harder to digest and may make you feel nauseous or bloated. Drink plenty of fluids but you should avoid alcoholic beverages for 24 hours.   ACTIVITY: Your care partner should take you home directly after the procedure. You should plan to take it easy, moving slowly for the rest of the day. You can resume normal activity the day after the procedure however YOU SHOULD NOT DRIVE, use power tools, machinery or perform tasks that involve climbing or major physical exertion for 24 hours (because of the sedation medicines used during the test).   SYMPTOMS TO REPORT IMMEDIATELY: A gastroenterologist can be reached at any hour. Please call [phone]  for any of the following symptoms:    Following upper endoscopy (EGD, EUS, ERCP, esophageal dilation) Vomiting of blood or coffee ground material  New, significant abdominal pain  New, significant chest pain or pain under the shoulder blades  Painful or persistently difficult swallowing  New shortness of breath  Black, tarry-looking or red, bloody stools  FOLLOW UP:  If any biopsies were taken you will be contacted by phone or by letter within the next 1-3 weeks. Call [phone]  if you have not heard about the biopsies in 3 weeks.  Please also call with any specific questions about appointments or follow up tests.

## 2014-10-13 ENCOUNTER — Encounter (HOSPITAL_COMMUNITY): Payer: Self-pay | Admitting: Internal Medicine

## 2014-10-15 ENCOUNTER — Telehealth: Payer: Self-pay | Admitting: Gastroenterology

## 2014-10-15 NOTE — Progress Notes (Signed)
Quick Note:  Please let her know she has yeast infection as suspected The fluconazole should treat it She needs to get a Prograf (tacrolimus) level to make sure its not affected by the fluconazole (s/p renal transplant) - send this info to Dr. Hassell Done webb please and let him know that she is on fluconazole Also see if getting better -   note she is a patient of Dr. Lynne Leader - I worked her into endo schedule on urgent basis in his absence ______

## 2014-10-16 NOTE — Telephone Encounter (Signed)
See cytology results for additional notes and recommendations

## 2014-10-27 ENCOUNTER — Encounter: Payer: Medicare Other | Admitting: Gastroenterology

## 2015-03-09 ENCOUNTER — Encounter: Payer: Self-pay | Admitting: Genetic Counselor

## 2015-05-21 ENCOUNTER — Encounter: Payer: Self-pay | Admitting: Gastroenterology

## 2015-11-23 ENCOUNTER — Other Ambulatory Visit: Payer: Self-pay | Admitting: Nurse Practitioner

## 2015-11-27 ENCOUNTER — Telehealth: Payer: Self-pay | Admitting: Gastroenterology

## 2015-11-27 DIAGNOSIS — R1314 Dysphagia, pharyngoesophageal phase: Secondary | ICD-10-CM

## 2015-11-27 MED ORDER — PANTOPRAZOLE SODIUM 40 MG PO TBEC: 40.0000 mg | DELAYED_RELEASE_TABLET | Freq: Two times a day (BID) | ORAL | Status: AC

## 2015-11-27 NOTE — Telephone Encounter (Signed)
Patient states she needs a refill of pantoprazole. Informed patient that I can send that to her pharmacy but she is due for an office visit. Patient states she already has an appt scheduled with her PCP next month. I informed patient that she can get her PCP to refill this medication if she sees her them more frequently. Patient states she will get them to refill from now but needs a refill for right now. Informed patient that I will give her one refill until she gets in with her PCP.

## 2016-06-22 DEATH — deceased

## 2021-04-02 ENCOUNTER — Encounter: Payer: Self-pay | Admitting: Gastroenterology
